# Patient Record
Sex: Male | Born: 1935 | Race: White | Hispanic: No | Smoking: Former smoker
Health system: Southern US, Community
[De-identification: ages and names within clinical notes are randomized; demographics above are authoritative.]

## PROBLEM LIST (undated history)

## (undated) DIAGNOSIS — N411 Chronic prostatitis: Secondary | ICD-10-CM

## (undated) DIAGNOSIS — C61 Malignant neoplasm of prostate: Secondary | ICD-10-CM

## (undated) DIAGNOSIS — I2510 Atherosclerotic heart disease of native coronary artery without angina pectoris: Secondary | ICD-10-CM

## (undated) DIAGNOSIS — E785 Hyperlipidemia, unspecified: Secondary | ICD-10-CM

## (undated) DIAGNOSIS — G459 Transient cerebral ischemic attack, unspecified: Secondary | ICD-10-CM

## (undated) DIAGNOSIS — I10 Essential (primary) hypertension: Secondary | ICD-10-CM

## (undated) DIAGNOSIS — I219 Acute myocardial infarction, unspecified: Secondary | ICD-10-CM

## (undated) DIAGNOSIS — Z952 Presence of prosthetic heart valve: Secondary | ICD-10-CM

## (undated) DIAGNOSIS — N289 Disorder of kidney and ureter, unspecified: Secondary | ICD-10-CM

## (undated) HISTORY — DX: Essential (primary) hypertension: I10

## (undated) HISTORY — DX: Presence of prosthetic heart valve: Z95.2

## (undated) HISTORY — PX: KIDNEY SURGERY: SHX687

## (undated) HISTORY — DX: Atherosclerotic heart disease of native coronary artery without angina pectoris: I25.10

## (undated) HISTORY — DX: Disorder of kidney and ureter, unspecified: N28.9

## (undated) HISTORY — DX: Chronic prostatitis: N41.1

## (undated) HISTORY — DX: Acute myocardial infarction, unspecified: I21.9

## (undated) HISTORY — DX: Malignant neoplasm of prostate: C61

## (undated) HISTORY — PX: CORONARY ARTERY BYPASS GRAFT: SHX141

## (undated) HISTORY — PX: AORTIC VALVE REPLACEMENT: SHX41

## (undated) HISTORY — DX: Hyperlipidemia, unspecified: E78.5

---

## 2010-03-01 ENCOUNTER — Encounter: Payer: Self-pay | Admitting: Internal Medicine

## 2010-03-10 ENCOUNTER — Encounter: Payer: Self-pay | Admitting: Internal Medicine

## 2010-04-10 ENCOUNTER — Encounter: Payer: Self-pay | Admitting: Internal Medicine

## 2013-05-13 ENCOUNTER — Ambulatory Visit: Payer: Self-pay | Admitting: Urology

## 2014-08-02 ENCOUNTER — Other Ambulatory Visit: Payer: Self-pay | Admitting: Family Medicine

## 2014-08-04 ENCOUNTER — Encounter: Payer: Self-pay | Admitting: Cardiovascular Disease

## 2014-08-04 ENCOUNTER — Encounter (INDEPENDENT_AMBULATORY_CARE_PROVIDER_SITE_OTHER): Payer: Self-pay

## 2014-08-04 ENCOUNTER — Ambulatory Visit (INDEPENDENT_AMBULATORY_CARE_PROVIDER_SITE_OTHER): Payer: Medicare Other | Admitting: Cardiovascular Disease

## 2014-08-04 VITALS — BP 126/80 | HR 60 | Ht 70.0 in | Wt 166.5 lb

## 2014-08-04 DIAGNOSIS — I10 Essential (primary) hypertension: Secondary | ICD-10-CM | POA: Diagnosis not present

## 2014-08-04 DIAGNOSIS — Z951 Presence of aortocoronary bypass graft: Secondary | ICD-10-CM

## 2014-08-04 DIAGNOSIS — E785 Hyperlipidemia, unspecified: Secondary | ICD-10-CM

## 2014-08-04 DIAGNOSIS — Z953 Presence of xenogenic heart valve: Secondary | ICD-10-CM

## 2014-08-04 DIAGNOSIS — I700 Atherosclerosis of aorta: Secondary | ICD-10-CM

## 2014-08-04 DIAGNOSIS — I25810 Atherosclerosis of coronary artery bypass graft(s) without angina pectoris: Secondary | ICD-10-CM

## 2014-08-04 DIAGNOSIS — Z954 Presence of other heart-valve replacement: Secondary | ICD-10-CM

## 2014-08-04 MED ORDER — LOSARTAN POTASSIUM 25 MG PO TABS: 25.0000 mg | ORAL_TABLET | Freq: Every day | ORAL | Status: DC

## 2014-08-04 MED ORDER — CARVEDILOL 6.25 MG PO TABS: 6.2500 mg | ORAL_TABLET | Freq: Two times a day (BID) | ORAL | Status: DC

## 2014-08-04 MED ORDER — PRAVASTATIN SODIUM 40 MG PO TABS: 40.0000 mg | ORAL_TABLET | Freq: Every day | ORAL | Status: DC

## 2014-08-04 NOTE — Progress Notes (Signed)
Patient ID: Jimmy Friedman, male    DOB: Apr 24, 1927, 81 y.o.   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  In general he reports that he is doing well. He is active, no regular exercise program. 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  Denies having any significant chest pain with exertion. No lower extremity edema.  Notes provided from previous cardiology shows LDL 66.  CT scan March 2016 showing moderate thoracic aortic atherosclerosis without aneurysm reticular and groundglass opacities at the lung bases, maybe atelectasis versus pneumonitis, also with gallstones, diverticulosis There was no ascending aorta aneurysm  No significant prior smoking history. He does report chewing on cigarettes/cigars.  Other past medical history Prior stress test reviewed with him from 03/24/2014. He exercised for 9 minutes, Bruce protocol, achieved 10 METS, hypertensive response with exercise, achieved target heart rate, no EKG changes concerning for ischemia  Echocardiogram 03/20/2014 with ejection fraction 60%, mild LVH, mild to moderately elevated right heart pressures 46-50 mmHg   Cardiac catheterization September 2011 with 90% proximal left circumflex disease, 90% mid LAD disease, 60% mid to distal RCA disease       Allergies  Allergen Reactions  . Penicillins     Other reaction(s): Unknown  . Sulfamethoxazole-Trimethoprim     Other  reaction(s): Unknown    No current outpatient prescriptions on file prior to visit.   No current facility-administered medications on file prior to visit.    Past Medical History  Diagnosis Date  . Coronary artery disease   . MI (myocardial infarction)   . S/P AVR (aortic valve replacement)   . Prostate cancer   . Kidney problem     Past Surgical History  Procedure Laterality Date  . Coronary artery bypass graft    . Aortic valve replacement    . Kidney surgery      Social History  reports that he has never smoked. He does not have any smokeless tobacco history on file. He reports that he drinks alcohol. He reports that he does not use illicit drugs.  Family History family history includes Heart Problems in his father and mother.   Review of Systems  Constitutional: Negative.   Respiratory: Negative.   Cardiovascular: Negative.   Gastrointestinal: Negative.   Endocrine: Negative.   Musculoskeletal: Negative.   Neurological: Negative.   Hematological: Negative.   Psychiatric/Behavioral: Negative.   All other systems reviewed and are negative.   BP 126/80 mmHg  Pulse 60  Ht 5\' 10"  (1.778 m)  Wt 166 lb 8 oz (75.524 kg)  BMI 23.89 kg/m2  Physical Exam  Constitutional: He is oriented to person, place, and time. He appears well-developed and well-nourished.  HENT:  Head: Normocephalic.  Nose: Nose normal.  Mouth/Throat: Oropharynx is clear and moist.  Eyes: Conjunctivae are normal. Pupils are equal, round, and reactive to light.  Neck: Normal range of motion. Neck supple. No JVD present.  Cardiovascular: Normal rate, regular rhythm, S1 normal, S2 normal and intact distal pulses.  Exam reveals no gallop  and no friction rub.   Murmur heard.  Crescendo systolic murmur is present with a grade of 2/6  Pulmonary/Chest: Effort normal and breath sounds normal. No respiratory distress. He has no wheezes. He has no rales. He exhibits no tenderness.  Abdominal: Soft.  Bowel sounds are normal. He exhibits no distension. There is no tenderness.  Musculoskeletal: Normal range of motion. He exhibits no edema or tenderness.  Lymphadenopathy:    He has no cervical adenopathy.  Neurological: He is alert and oriented to person, place, and time. Coordination normal.  Skin: Skin is warm and dry. No rash noted. No erythema.  Psychiatric: He has a normal mood and affect. His behavior is normal. Judgment and thought content normal.      Assessment and Plan   Nursing note and vitals reviewed.

## 2014-08-04 NOTE — Patient Instructions (Addendum)
You are doing well. No medication changes were made.  Please call us if you have new issues that need to be addressed before your next appt.  Your physician wants you to follow-up in: 6 months.  You will receive a reminder letter in the mail two months in advance. If you don't receive a letter, please call our office to schedule the follow-up appointment.   

## 2014-08-04 NOTE — Assessment & Plan Note (Signed)
Prior echocardiogram March 2016 We'll repeat March 2017 at his request for recertification for pilot license

## 2014-08-04 NOTE — Assessment & Plan Note (Signed)
Blood pressure is well controlled on today's visit. No changes made to the medications. 

## 2014-08-04 NOTE — Assessment & Plan Note (Signed)
Cholesterol is at goal on the current lipid regimen. No changes to the medications were made. Lab work to be done through primary care

## 2014-08-04 NOTE — Assessment & Plan Note (Signed)
Currently with no symptoms of angina. No further workup at this time. Continue current medication regimen. 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

## 2014-08-04 NOTE — Assessment & Plan Note (Signed)
We will try to request his bypass surgery details from 2011 Three-vessel bypass

## 2014-08-18 DIAGNOSIS — N411 Chronic prostatitis: Secondary | ICD-10-CM

## 2014-08-19 ENCOUNTER — Ambulatory Visit (INDEPENDENT_AMBULATORY_CARE_PROVIDER_SITE_OTHER): Payer: Medicare Other | Admitting: Unknown Physician Specialty

## 2014-08-19 ENCOUNTER — Encounter: Payer: Self-pay | Admitting: Unknown Physician Specialty

## 2014-08-19 VITALS — BP 171/63 | HR 71 | Temp 97.6°F | Ht 70.0 in | Wt 162.6 lb

## 2014-08-19 DIAGNOSIS — I25810 Atherosclerosis of coronary artery bypass graft(s) without angina pectoris: Secondary | ICD-10-CM

## 2014-08-19 DIAGNOSIS — Z954 Presence of other heart-valve replacement: Secondary | ICD-10-CM | POA: Diagnosis not present

## 2014-08-19 DIAGNOSIS — C61 Malignant neoplasm of prostate: Secondary | ICD-10-CM | POA: Diagnosis not present

## 2014-08-19 DIAGNOSIS — Z953 Presence of xenogenic heart valve: Secondary | ICD-10-CM

## 2014-08-19 DIAGNOSIS — N411 Chronic prostatitis: Secondary | ICD-10-CM | POA: Diagnosis not present

## 2014-08-19 DIAGNOSIS — I10 Essential (primary) hypertension: Secondary | ICD-10-CM

## 2014-08-19 DIAGNOSIS — E785 Hyperlipidemia, unspecified: Secondary | ICD-10-CM

## 2014-08-19 LAB — UA/M W/RFLX CULTURE, ROUTINE
Bilirubin, UA: NEGATIVE
Glucose, UA: NEGATIVE
Ketones, UA: NEGATIVE
Leukocytes, UA: NEGATIVE
NITRITE UA: NEGATIVE
Protein, UA: NEGATIVE
RBC, UA: NEGATIVE
SPEC GRAV UA: 1.015 (ref 1.005–1.030)
UUROB: 0.2 mg/dL (ref 0.2–1.0)
pH, UA: 5.5 (ref 5.0–7.5)

## 2014-08-19 LAB — MICROALBUMIN, URINE WAIVED
Creatinine, Urine Waived: 100 mg/dL (ref 10–300)
Microalb, Ur Waived: 10 mg/L (ref 0–19)

## 2014-08-19 MED ORDER — CLINDAMYCIN HCL 150 MG PO CAPS: 600.0000 mg | ORAL_CAPSULE | Freq: Every day | ORAL | Status: DC | PRN

## 2014-08-19 NOTE — Assessment & Plan Note (Signed)
Poor control today.  Ded not take medications today.  Good control at home and last visit.

## 2014-08-19 NOTE — Assessment & Plan Note (Signed)
Refill prophylactic antibiotics

## 2014-08-19 NOTE — Assessment & Plan Note (Signed)
Stable.  Planning to see a urologist at [facility]

## 2014-08-19 NOTE — Assessment & Plan Note (Signed)
Check PSA today. 

## 2014-08-19 NOTE — Progress Notes (Signed)
BP 171/63 mmHg  Pulse 71  Temp(Src) 97.6 F (36.4 C)  Ht 5\' 10"  (1.778 m)  Wt 162 lb 9.6 oz (73.755 kg)  BMI 23.33 kg/m2  SpO2 96%   Subjective:    Patient ID: Jimmy Friedman, male    DOB: [birth year], 81 y.o.   MRN: [id number]  HPI: Jimmy Friedman is a 81 y.o. male  Chief Complaint  Patient presents with  . Urinary Tract Infection   Hypertension/Hypercholesterol These problems are chronic.  In the past these were filled by the cardiologist following aortic valve replacement.  He would like Clindamyacin refilled that he gets before dental procedures. No chest pain or SOB. BP is up today and check it at home.  He did not take his medication today.  Typically SBP is bewlow 140 and check it weekly at home.      Chronic prostatitis  Pt with a history of prostate cancer and chronic prostatitis.  This is stable.  Needs PSA testing.  No urinary problems today.  Takes Macrodantin prn.    Relevant past medical, surgical, family and social history reviewed and updated as indicated. Interim medical history since our last visit reviewed. Allergies and medications reviewed and updated.  Review of Systems  Constitutional: Negative.   HENT: Negative.   Eyes: Negative.   Respiratory: Negative.   Cardiovascular: Negative.   Gastrointestinal: Negative.   Endocrine: Negative.   Genitourinary: Negative.   Skin: Negative.   Allergic/Immunologic: Negative.   Neurological: Negative.   Hematological: Negative.   Psychiatric/Behavioral: Negative.     Per HPI unless specifically indicated above     Objective:    BP 171/63 mmHg  Pulse 71  Temp(Src) 97.6 F (36.4 C)  Ht 5\' 10"  (1.778 m)  Wt 162 lb 9.6 oz (73.755 kg)  BMI 23.33 kg/m2  SpO2 96%  Wt Readings from Last 3 Encounters:  08/19/14 162 lb 9.6 oz (73.755 kg)  07/29/13 165 lb (74.844 kg)  08/04/14 166 lb 8 oz (75.524 kg)    Physical Exam  Constitutional: He is oriented to person, place, and time. He appears  well-developed and well-nourished. No distress.  HENT:  Head: Normocephalic and atraumatic.  Eyes: Conjunctivae and lids are normal. Right eye exhibits no discharge. Left eye exhibits no discharge. No scleral icterus.  Cardiovascular: Normal rate, regular rhythm and normal heart sounds.   Pulmonary/Chest: Effort normal and breath sounds normal. No respiratory distress.  Abdominal: Normal appearance. There is no splenomegaly or hepatomegaly. There is tenderness.  Musculoskeletal: Normal range of motion.  Neurological: He is alert and oriented to person, place, and time.  Skin: Skin is intact. No rash noted. No pallor.  Psychiatric: He has a normal mood and affect. His behavior is normal. Judgment and thought content normal.    No results found for this or any previous visit.    Assessment & Plan:   Problem List Items Addressed This Visit      Unprioritized   Hyperlipidemia    Stable.  Continue present meds      Relevant Orders   Lipid Panel w/o Chol/HDL Ratio   Comprehensive metabolic panel   Essential hypertension    Poor control today.  Ded not take medications today.  Good control at home and last visit.        Relevant Orders   Lipid Panel w/o Chol/HDL Ratio   Comprehensive metabolic panel   Uric acid   Microalbumin, Urine Waived   S/P aortic valve replacement with  bioprosthetic valve    Refill prophylactic antibiotics      Chronic prostatitis - Primary    Stable.  Planning to see a urologist at [place]   UA/M w/rflx Culture, Routine   PSA   Prostate cancer    Check PSA today      Relevant Orders   PSA       Follow up plan: Return in about 6 months (around 02/19/2015) for physical.

## 2014-08-19 NOTE — Assessment & Plan Note (Signed)
Stable.  Continue present meds 

## 2014-08-20 LAB — COMPREHENSIVE METABOLIC PANEL
ALBUMIN: 4.3 g/dL (ref 3.5–4.7)
ALT: 20 IU/L (ref 0–44)
AST: 30 IU/L (ref 0–40)
Albumin/Globulin Ratio: 1.8 (ref 1.1–2.5)
Alkaline Phosphatase: 50 IU/L (ref 39–117)
BUN/Creatinine Ratio: 18 (ref 10–22)
BUN: 21 mg/dL (ref 8–27)
Bilirubin Total: 1 mg/dL (ref 0.0–1.2)
CHLORIDE: 99 mmol/L (ref 97–108)
CO2: 27 mmol/L (ref 18–29)
CREATININE: 1.2 mg/dL (ref 0.76–1.27)
Calcium: 9.7 mg/dL (ref 8.6–10.2)
GFR, EST AFRICAN AMERICAN: 63 mL/min/{1.73_m2} (ref >59)
GFR, EST NON AFRICAN AMERICAN: 55 mL/min/{1.73_m2} — AB (ref >59)
GLUCOSE: 92 mg/dL (ref 65–99)
Globulin, Total: 2.4 g/dL (ref 1.5–4.5)
Potassium: 4.5 mmol/L (ref 3.5–5.2)
Sodium: 139 mmol/L (ref 134–144)
TOTAL PROTEIN: 6.7 g/dL (ref 6.0–8.5)

## 2014-08-20 LAB — LIPID PANEL W/O CHOL/HDL RATIO
Cholesterol, Total: 133 mg/dL (ref 100–199)
HDL: 48 mg/dL (ref >39)
LDL Calculated: 71 mg/dL (ref 0–99)
TRIGLYCERIDES: 70 mg/dL (ref 0–149)
VLDL Cholesterol Cal: 14 mg/dL (ref 5–40)

## 2014-08-20 LAB — URIC ACID: Uric Acid: 6.8 mg/dL (ref 3.7–8.6)

## 2014-08-20 LAB — PSA: Prostate Specific Ag, Serum: 4.5 ng/mL — ABNORMAL HIGH (ref 0.0–4.0)

## 2014-08-21 ENCOUNTER — Encounter: Payer: Self-pay | Admitting: Unknown Physician Specialty

## 2014-08-27 ENCOUNTER — Other Ambulatory Visit: Payer: Self-pay | Admitting: Family Medicine

## 2014-09-19 ENCOUNTER — Other Ambulatory Visit: Payer: Self-pay | Admitting: Family Medicine

## 2014-10-21 ENCOUNTER — Ambulatory Visit: Payer: Medicare Other

## 2014-10-21 DIAGNOSIS — Z23 Encounter for immunization: Secondary | ICD-10-CM

## 2014-11-03 ENCOUNTER — Other Ambulatory Visit: Payer: Self-pay | Admitting: Family Medicine

## 2015-01-02 ENCOUNTER — Other Ambulatory Visit: Payer: Self-pay | Admitting: Family Medicine

## 2015-01-07 ENCOUNTER — Other Ambulatory Visit: Payer: Self-pay | Admitting: Unknown Physician Specialty

## 2015-01-07 DIAGNOSIS — N411 Chronic prostatitis: Secondary | ICD-10-CM

## 2015-01-07 NOTE — Progress Notes (Signed)
Patient is on lab schedule for tomorrow. They will arrive him and I will release the order.

## 2015-01-07 NOTE — Progress Notes (Signed)
Phone call with patient about pain with urination and noted cloudy urine.  He was allowed to bring a urine in without an appointment.  I will be able to run it tomorrow and notify patient of results.  He is presently on daily doxycycline for chronic UTIs.

## 2015-01-08 ENCOUNTER — Other Ambulatory Visit: Payer: Medicare Other

## 2015-01-08 ENCOUNTER — Other Ambulatory Visit: Payer: Self-pay | Admitting: Unknown Physician Specialty

## 2015-01-08 LAB — MICROSCOPIC EXAMINATION: Epithelial Cells (non renal): NONE SEEN /hpf (ref 0–10)

## 2015-01-08 MED ORDER — CIPROFLOXACIN HCL 500 MG PO TABS: 500.0000 mg | ORAL_TABLET | Freq: Two times a day (BID) | ORAL | Status: DC

## 2015-01-08 NOTE — Progress Notes (Signed)
Called and let patient know about urine and prescription.

## 2015-01-08 NOTE — Progress Notes (Signed)
Please tell patient urine is positive and I called in Cipro for him

## 2015-01-11 LAB — URINE CULTURE, REFLEX

## 2015-01-12 ENCOUNTER — Encounter: Payer: Self-pay | Admitting: Unknown Physician Specialty

## 2015-01-12 ENCOUNTER — Ambulatory Visit (INDEPENDENT_AMBULATORY_CARE_PROVIDER_SITE_OTHER): Payer: Medicare Other | Admitting: Unknown Physician Specialty

## 2015-01-12 VITALS — BP 110/62 | HR 73 | Temp 97.4°F | Ht 69.7 in | Wt 167.0 lb

## 2015-01-12 DIAGNOSIS — N411 Chronic prostatitis: Secondary | ICD-10-CM

## 2015-01-12 DIAGNOSIS — N3090 Cystitis, unspecified without hematuria: Secondary | ICD-10-CM

## 2015-01-12 LAB — MICROSCOPIC EXAMINATION
RBC MICROSCOPIC, UA: NONE SEEN /HPF (ref 0–?)
WBC UA: NONE SEEN /HPF (ref 0–?)

## 2015-01-12 LAB — UA/M W/RFLX CULTURE, ROUTINE
Bilirubin, UA: NEGATIVE
GLUCOSE, UA: NEGATIVE
Leukocytes, UA: NEGATIVE
NITRITE UA: NEGATIVE
RBC, UA: NEGATIVE
Specific Gravity, UA: 1.025 (ref 1.005–1.030)
UUROB: 0.2 mg/dL (ref 0.2–1.0)
pH, UA: 5.5 (ref 5.0–7.5)

## 2015-01-12 NOTE — Progress Notes (Signed)
   BP 110/62 mmHg  Pulse 73  Temp(Src) 97.4 F (36.3 C)  Ht 5' 9.7" (1.77 m)  Wt 167 lb (75.751 kg)  BMI 24.18 kg/m2  SpO2 92%   Subjective:    Patient ID: Jimmy Friedman, male    DOB: [birth year], 82 y.o.   MRN: [id number]  HPI: Jimmy Friedman is a 82 y.o. male  Chief Complaint  Patient presents with  . Urinary Tract Infection    pt was given cipro 01/08/15 and states he thinks it helped. States he is not having anymore symptoms.   As above, pt with history of UTIs due to urinary retentin due to BPH.  Culture results show cipro is working.  He does follow up with a Urologist and does not want an additional visit.   Relevant past medical, surgical, family and social history reviewed and updated as indicated. Interim medical history since our last visit reviewed. Allergies and medications reviewed and updated.  Review of Systems  Per HPI unless specifically indicated above     Objective:    BP 110/62 mmHg  Pulse 73  Temp(Src) 97.4 F (36.3 C)  Ht 5' 9.7" (1.77 m)  Wt 167 lb (75.751 kg)  BMI 24.18 kg/m2  SpO2 92%  Wt Readings from Last 3 Encounters:  01/12/15 167 lb (75.751 kg)  08/19/14 162 lb 9.6 oz (73.755 kg)  07/29/13 165 lb (74.844 kg)    Physical Exam  Constitutional: He is oriented to person, place, and time. He appears well-developed and well-nourished. No distress.  HENT:  Head: Normocephalic and atraumatic.  Eyes: Conjunctivae and lids are normal. Right eye exhibits no discharge. Left eye exhibits no discharge. No scleral icterus.  Neck: Normal range of motion. Neck supple. No JVD present. Carotid bruit is not present.  Cardiovascular: Normal rate, regular rhythm and normal heart sounds.   Pulmonary/Chest: Effort normal and breath sounds normal. No respiratory distress.  Abdominal: Normal appearance. There is no splenomegaly or hepatomegaly.  Musculoskeletal: Normal range of motion.  Neurological: He is alert and oriented to person, place, and  time.  Skin: Skin is warm, dry and intact. No rash noted. No pallor.  Psychiatric: He has a normal mood and affect. His behavior is normal. Judgment and thought content normal.    Results for orders placed or performed in visit on 01/08/15  Microscopic Examination  Result Value Ref Range   WBC, UA 6-10 (A) 0 -  5 /hpf   RBC, UA 3-10 (A) 0 -  2 /hpf   Epithelial Cells (non renal) None seen 0 - 10 /hpf   Bacteria, UA Moderate (A) None seen/Few  UA/M w/rflx Culture, Routine  Result Value Ref Range   Urine Culture, Routine Final report (A)    Urine Culture result 1 Proteus mirabilis (A)    ANTIMICROBIAL SUSCEPTIBILITY Comment   Urine Culture, Routine  Result Value Ref Range   Urine Culture result 1 Gram negative rods (A)       Assessment & Plan:   Problem List Items Addressed This Visit      Unprioritized   Chronic prostatitis - Primary   Relevant Orders   UA/M w/rflx Culture, Routine    Other Visit Diagnoses    Cystitis        resolved        Follow up plan: Return if symptoms worsen or fail to improve.

## 2015-02-16 ENCOUNTER — Other Ambulatory Visit: Payer: Self-pay | Admitting: Family Medicine

## 2015-02-16 NOTE — Telephone Encounter (Signed)
Your patient 

## 2015-02-18 ENCOUNTER — Encounter: Payer: Self-pay | Admitting: Cardiovascular Disease

## 2015-02-18 ENCOUNTER — Ambulatory Visit (INDEPENDENT_AMBULATORY_CARE_PROVIDER_SITE_OTHER): Payer: Medicare Other | Admitting: Cardiovascular Disease

## 2015-02-18 VITALS — BP 148/64 | HR 66 | Ht 70.0 in | Wt 171.8 lb

## 2015-02-18 DIAGNOSIS — I10 Essential (primary) hypertension: Secondary | ICD-10-CM | POA: Diagnosis not present

## 2015-02-18 DIAGNOSIS — I25810 Atherosclerosis of coronary artery bypass graft(s) without angina pectoris: Secondary | ICD-10-CM

## 2015-02-18 DIAGNOSIS — I700 Atherosclerosis of aorta: Secondary | ICD-10-CM

## 2015-02-18 DIAGNOSIS — Z954 Presence of other heart-valve replacement: Secondary | ICD-10-CM

## 2015-02-18 DIAGNOSIS — Z951 Presence of aortocoronary bypass graft: Secondary | ICD-10-CM | POA: Diagnosis not present

## 2015-02-18 DIAGNOSIS — Z953 Presence of xenogenic heart valve: Secondary | ICD-10-CM

## 2015-02-18 DIAGNOSIS — E785 Hyperlipidemia, unspecified: Secondary | ICD-10-CM

## 2015-02-18 NOTE — Assessment & Plan Note (Signed)
Cholesterol is at goal on the current lipid regimen. No changes to the medications were made.  

## 2015-02-18 NOTE — Assessment & Plan Note (Signed)
Echocardiogram March 2016 at outside facility showing stable bioprosthetic aortic valve He does not want echocardiogram this year, we'll reconsider next year, no change in clinical symptoms

## 2015-02-18 NOTE — Patient Instructions (Signed)
You are doing well. No medication changes were made.  Please call us if you have new issues that need to be addressed before your next appt.  Your physician wants you to follow-up in: 6 months.  You will receive a reminder letter in the mail two months in advance. If you don't receive a letter, please call our office to schedule the follow-up appointment.   

## 2015-02-18 NOTE — Assessment & Plan Note (Signed)
Blood pressure is well controlled on today's visit. No changes made to the medications. 

## 2015-02-18 NOTE — Assessment & Plan Note (Signed)
Atherosclerosis seen on previous CT scan Again recommended aggressive cholesterol management Currently nonsmoker, nondiabetic

## 2015-02-18 NOTE — Assessment & Plan Note (Signed)
Currently with no symptoms of angina. No further workup at this time. Continue current medication regimen. 

## 2015-02-18 NOTE — Progress Notes (Signed)
Patient ID: Jimmy Friedman, male    DOB: [birth year], 82 y.o.   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 Last stress test was March 2016  In general he reports that he is doing well. He is active, no regular exercise program. 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  Denies having any significant chest pain with exertion.  No lower extremity edema.    lab work below reviewed with him in detail    Lab Results      Component                Value               Date                      CHOL                     133                 08/19/2014                HDL                      48                  08/19/2014                LDLCALC                  71                  08/19/2014                TRIG                     70                  08/19/2014         EKG on today's visit shows normal sinus rhythm with rate 66 bpm, APCs, interventricular conduction delay, poor R-wave progression through the anterior precordial leads, left anterior fascicular block  Other past medical history reviewed CT scan March 2016 showing moderate thoracic aortic atherosclerosis without aneurysm reticular and groundglass opacities at the lung bases, maybe atelectasis versus pneumonitis, also with gallstones, diverticulosis There was no ascending aorta aneurysm  No significant prior smoking history.  He does report chewing on cigarettes/cigars.  Prior stress test  03/24/2014. He exercised for 9 minutes, Bruce protocol, achieved 10 METS, hypertensive response with exercise, achieved target heart rate, no EKG changes concerning for ischemia  Echocardiogram 03/20/2014 with ejection fraction 60%, mild LVH, mild to moderately elevated right heart pressures 46-50 mmHg   Cardiac catheterization September 2011 with 90% proximal left circumflex disease, 90% mid LAD disease, 60% mid to distal RCA disease       Allergies  Allergen Reactions  . Penicillins     Other reaction(s): Unknown  . Sulfamethoxazole-Trimethoprim     Other reaction(s): Unknown    Current Outpatient Prescriptions on File Prior to Visit  Medication Sig Dispense Refill  . aspirin 81 MG tablet Take 81 mg by mouth daily.    Marland Kitchen CALCIUM-MAGNESIUM-ZINC PO Take by mouth daily.    . carvedilol (COREG) 6.25 MG tablet Take 1 tablet (6.25 mg total) by mouth 2 (two) times daily. 180 tablet 3  . Cholecalciferol (VITAMIN D3) 2000 UNITS TABS Take 2,000 mg by mouth daily.    . clindamycin (CLEOCIN) 150 MG capsule Take 4 capsules (600 mg total) by mouth daily as needed. One hour before dental treatments. 24 capsule 1  . doxycycline (VIBRA-TABS) 100 MG tablet TAKE 1 TABLET BY MOUTH DAILY 30 tablet 0  . fluticasone (FLOVENT HFA) 220 MCG/ACT inhaler Inhale into the lungs.    . Glucosamine-Chondroitin 500-400 MG CAPS Take by mouth 2 (two) times daily.    Marland Kitchen losartan (COZAAR) 25 MG tablet Take 1 tablet (25 mg total) by mouth daily. 90 tablet 3  . Multiple Vitamin (MULTIVITAMIN) tablet Take 1 tablet by mouth daily.    . pravastatin (PRAVACHOL) 40 MG tablet TAKE 1 TABLET EACH DAY 30 tablet 1  . vitamin C (ASCORBIC ACID) 500 MG tablet Take 500 mg by mouth daily.    Marland Kitchen VITAMIN E PO Take by mouth daily.     No current facility-administered medications on file prior to visit.    Past Medical History  Diagnosis Date  . Coronary artery disease    . MI (myocardial infarction) ([place])   . S/P AVR (aortic valve replacement)   . Prostate cancer ([place])   . Kidney problem   . Hypertension   . Hyperlipidemia   . Chronic prostatitis     Past Surgical History  Procedure Laterality Date  . Coronary artery bypass graft    . Aortic valve replacement    . Kidney surgery      Social History  reports that he has never smoked. He has never used smokeless tobacco. He reports that he does not drink alcohol or use illicit drugs.  Family History family history includes Heart Problems in his father, mother, and son; Hypertension in his father; Osteoporosis in his mother.   Review of Systems  Constitutional: Negative.   Respiratory: Negative.   Cardiovascular: Negative.   Gastrointestinal: Negative.   Endocrine: Negative.   Musculoskeletal: Negative.   Neurological: Negative.   Hematological: Negative.   Psychiatric/Behavioral: Negative.   All other systems reviewed and are negative.   BP 148/64 mmHg  Pulse 66  Ht 5\' 10"  (1.778 m)  Wt 171 lb 12 oz (77.905 kg)  BMI 24.64 kg/m2  Physical Exam  Constitutional: He is oriented to person, place, and time. He appears well-developed and well-nourished.  HENT:  Head: Normocephalic.  Nose: Nose normal.  Mouth/Throat: Oropharynx is clear and moist.  Eyes: Conjunctivae are normal. Pupils are equal, round, and reactive to light.  Neck: Normal range of motion. Neck supple. No JVD present.  Cardiovascular: Normal rate, regular rhythm, S1 normal, S2 normal and intact distal pulses.  Exam reveals no gallop and no friction rub.   Murmur heard.  Crescendo systolic murmur is present with a grade of 2/6  Pulmonary/Chest: Effort normal and breath sounds normal. No respiratory distress. He has no wheezes. He has no rales. He exhibits no tenderness.  Abdominal: Soft. Bowel sounds are normal. He exhibits no  distension. There is no tenderness.  Musculoskeletal: Normal range of motion. He exhibits no  edema or tenderness.  Lymphadenopathy:    He has no cervical adenopathy.  Neurological: He is alert and oriented to person, place, and time. Coordination normal.  Skin: Skin is warm and dry. No rash noted. No erythema.  Psychiatric: He has a normal mood and affect. His behavior is normal. Judgment and thought content normal.      Assessment and Plan   Nursing note and vitals reviewed.

## 2015-03-02 ENCOUNTER — Encounter: Payer: Self-pay | Admitting: Unknown Physician Specialty

## 2015-03-02 ENCOUNTER — Ambulatory Visit (INDEPENDENT_AMBULATORY_CARE_PROVIDER_SITE_OTHER): Payer: Medicare Other | Admitting: Unknown Physician Specialty

## 2015-03-02 VITALS — BP 105/66 | HR 84 | Temp 98.6°F | Ht 69.5 in | Wt 166.0 lb

## 2015-03-02 DIAGNOSIS — B359 Dermatophytosis, unspecified: Secondary | ICD-10-CM

## 2015-03-02 DIAGNOSIS — D229 Melanocytic nevi, unspecified: Secondary | ICD-10-CM

## 2015-03-02 DIAGNOSIS — I10 Essential (primary) hypertension: Secondary | ICD-10-CM | POA: Diagnosis not present

## 2015-03-02 DIAGNOSIS — I25810 Atherosclerosis of coronary artery bypass graft(s) without angina pectoris: Secondary | ICD-10-CM | POA: Diagnosis not present

## 2015-03-02 DIAGNOSIS — C61 Malignant neoplasm of prostate: Secondary | ICD-10-CM

## 2015-03-02 DIAGNOSIS — Z23 Encounter for immunization: Secondary | ICD-10-CM

## 2015-03-02 DIAGNOSIS — N411 Chronic prostatitis: Secondary | ICD-10-CM

## 2015-03-02 DIAGNOSIS — Z951 Presence of aortocoronary bypass graft: Secondary | ICD-10-CM

## 2015-03-02 DIAGNOSIS — E785 Hyperlipidemia, unspecified: Secondary | ICD-10-CM

## 2015-03-02 DIAGNOSIS — R05 Cough: Secondary | ICD-10-CM | POA: Diagnosis not present

## 2015-03-02 DIAGNOSIS — R053 Chronic cough: Secondary | ICD-10-CM

## 2015-03-02 LAB — MICROALBUMIN, URINE WAIVED
CREATININE, URINE WAIVED: 100 mg/dL (ref 10–300)
Microalb, Ur Waived: 30 mg/L — ABNORMAL HIGH (ref 0–19)

## 2015-03-02 MED ORDER — CLOTRIMAZOLE-BETAMETHASONE 1-0.05 % EX CREA: 1.0000 "application " | TOPICAL_CREAM | Freq: Two times a day (BID) | CUTANEOUS | Status: DC

## 2015-03-02 NOTE — Assessment & Plan Note (Signed)
Check lipid panel  

## 2015-03-02 NOTE — Progress Notes (Signed)
+  BP 105/66 mmHg  Pulse 84  Temp(Src) 98.6 F (37 C)  Ht 5' 9.5" (1.765 m)  Wt 166 lb (75.297 kg)  BMI 24.17 kg/m2  SpO2 89%   Subjective:    Patient ID: Jimmy Friedman, male    DOB: [birth year], 82 y.o.   MRN: [id number]  HPI: Jimmy Friedman is a 82 y.o. male  Chief Complaint  Patient presents with  . 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    Functional Status Survey: Is the patient deaf or have difficulty hearing?: No Does the patient have difficulty seeing, even when wearing glasses/contacts?: No Does the patient have difficulty concentrating, remembering, or making decisions?: No Does the patient have difficulty walking or climbing stairs?: No Does the patient have difficulty dressing or bathing?: No Does the patient have difficulty doing errands alone such as visiting a doctor's office or shopping?: No  Depression screen PHQ 2/9 03/02/2015  Decreased Interest 0  Down, Depressed, Hopeless 0  PHQ - 2 Score 0    Fall Risk  03/02/2015  Falls in the past year? No    Relevant past medical, surgical, family and social history reviewed and updated as indicated. Interim medical history since our last visit reviewed. Allergies and medications reviewed and updated.  See functional status, depression screen, and fall's risk assessment  under the appropriate section.    Pt is able to perform complex mental tasks, recognize clock face, recognize time and do a 3 item recall.     Review of Systems  Per HPI unless specifically indicated above     Objective:    BP 105/66 mmHg  Pulse 84  Temp(Src) 98.6 F (37 C)  Ht 5' 9.5"  (1.765 m)  Wt 166 lb (75.297 kg)  BMI 24.17 kg/m2  SpO2 89%  Wt Readings from Last 3 Encounters:  03/02/15 166 lb (75.297 kg)  02/18/15 171 lb 12 oz (77.905 kg)  01/12/15 167 lb (75.751 kg)    Physical Exam  Constitutional: He is oriented to person, place, and time. He appears well-developed and well-nourished.  HENT:  Head: Normocephalic.  Right Ear: Tympanic membrane, external ear and ear canal normal.  Left Ear: Tympanic membrane, external ear and ear canal normal.  Mouth/Throat: Uvula is midline, oropharynx is clear and moist and mucous membranes are normal.  Eyes: Pupils are equal, round, and reactive to light.  Cardiovascular: Normal rate and regular rhythm.  Exam reveals no gallop and no friction rub.   Murmur heard.  Systolic murmur is present with a grade of 2/6  Pulmonary/Chest: Effort normal and breath sounds normal. No respiratory distress.  Abdominal: Soft. Bowel sounds are normal. He exhibits no distension. There is no tenderness.  Musculoskeletal: Normal range of motion.  Neurological: He is alert and oriented to person, place, and time. He has normal reflexes.  Skin: Skin is warm and dry.  2 areas of pt concern.  Back of right neck circular area with raised borders.  Fleshy mole on right lower back with clear borders and smaller than an eraser  Psychiatric: He has a normal mood and affect. His behavior is normal. Judgment and thought content normal.  Assessment & Plan:   Problem List Items Addressed This Visit      Unprioritized   S/P CABG x 3   Hyperlipidemia    Check lipid panel      Relevant Orders   Lipid Panel w/o Chol/HDL Ratio   Essential hypertension    Stable, continue present medications.        Relevant Orders   Comprehensive metabolic panel   Microalbumin, Urine Waived   Chronic prostatitis    Check PSA      Relevant Orders   PSA   Prostate cancer ([place]) - Primary   Relevant Medications   oxyCODONE-acetaminophen  (PERCOCET/ROXICET) 5-325 MG tablet   Other Relevant Orders   PSA   Chronic cough    Stable on present medications       Other Visit Diagnoses    Need for pneumococcal vaccination        Relevant Orders    Pneumococcal polysaccharide vaccine 23-valent greater than or equal to 2yo subcutaneous/IM (Completed)    Benign mole        Ringworm        r/o ringworm and give Lotrisone cream but will come in for a bx if no improvement    Relevant Medications    clotrimazole-betamethasone (LOTRISONE) cream        Follow up plan: Return in about 6 months (around 08/30/2015) for plus schedule skin biopsy.

## 2015-03-02 NOTE — Assessment & Plan Note (Signed)
Check PSA. ?

## 2015-03-02 NOTE — Assessment & Plan Note (Signed)
Stable, continue present medications.   

## 2015-03-02 NOTE — Assessment & Plan Note (Signed)
Stable on present medications.   

## 2015-03-03 LAB — COMPREHENSIVE METABOLIC PANEL
ALT: 18 IU/L (ref 0–44)
AST: 26 IU/L (ref 0–40)
Albumin/Globulin Ratio: 1.7 (ref 1.1–2.5)
Albumin: 4 g/dL (ref 3.5–4.7)
Alkaline Phosphatase: 39 IU/L (ref 39–117)
BUN/Creatinine Ratio: 19 (ref 10–22)
BUN: 26 mg/dL (ref 8–27)
Bilirubin Total: 0.8 mg/dL (ref 0.0–1.2)
CO2: 26 mmol/L (ref 18–29)
Calcium: 9.8 mg/dL (ref 8.6–10.2)
Chloride: 99 mmol/L (ref 96–106)
Creatinine, Ser: 1.4 mg/dL — ABNORMAL HIGH (ref 0.76–1.27)
GFR calc Af Amer: 52 mL/min/{1.73_m2} — ABNORMAL LOW (ref >59)
GFR calc non Af Amer: 45 mL/min/{1.73_m2} — ABNORMAL LOW (ref >59)
Globulin, Total: 2.3 g/dL (ref 1.5–4.5)
Glucose: 129 mg/dL — ABNORMAL HIGH (ref 65–99)
Potassium: 4.3 mmol/L (ref 3.5–5.2)
Sodium: 141 mmol/L (ref 134–144)
Total Protein: 6.3 g/dL (ref 6.0–8.5)

## 2015-03-03 LAB — LIPID PANEL W/O CHOL/HDL RATIO
Cholesterol, Total: 121 mg/dL (ref 100–199)
HDL: 39 mg/dL — ABNORMAL LOW (ref >39)
LDL CALC: 61 mg/dL (ref 0–99)
Triglycerides: 105 mg/dL (ref 0–149)
VLDL CHOLESTEROL CAL: 21 mg/dL (ref 5–40)

## 2015-03-03 LAB — PSA: Prostate Specific Ag, Serum: 4.8 ng/mL — ABNORMAL HIGH (ref 0.0–4.0)

## 2015-03-19 ENCOUNTER — Other Ambulatory Visit: Payer: Self-pay | Admitting: Unknown Physician Specialty

## 2015-04-04 ENCOUNTER — Encounter: Payer: Self-pay | Admitting: Emergency Medicine

## 2015-04-04 ENCOUNTER — Emergency Department: Payer: Medicare Other

## 2015-04-04 ENCOUNTER — Emergency Department
Admission: EM | Admit: 2015-04-04 | Discharge: 2015-04-04 | Disposition: A | Discharge status: Home / Self Care | Payer: Medicare Other | Attending: Emergency Medicine | Admitting: Emergency Medicine

## 2015-04-04 DIAGNOSIS — I119 Hypertensive heart disease without heart failure: Secondary | ICD-10-CM | POA: Diagnosis not present

## 2015-04-04 DIAGNOSIS — R0602 Shortness of breath: Secondary | ICD-10-CM | POA: Diagnosis present

## 2015-04-04 DIAGNOSIS — Z79899 Other long term (current) drug therapy: Secondary | ICD-10-CM | POA: Insufficient documentation

## 2015-04-04 DIAGNOSIS — I700 Atherosclerosis of aorta: Secondary | ICD-10-CM | POA: Diagnosis not present

## 2015-04-04 DIAGNOSIS — Z7982 Long term (current) use of aspirin: Secondary | ICD-10-CM | POA: Insufficient documentation

## 2015-04-04 DIAGNOSIS — J810 Acute pulmonary edema: Secondary | ICD-10-CM | POA: Diagnosis not present

## 2015-04-04 DIAGNOSIS — Z952 Presence of prosthetic heart valve: Secondary | ICD-10-CM | POA: Diagnosis not present

## 2015-04-04 DIAGNOSIS — E785 Hyperlipidemia, unspecified: Secondary | ICD-10-CM | POA: Insufficient documentation

## 2015-04-04 DIAGNOSIS — I2510 Atherosclerotic heart disease of native coronary artery without angina pectoris: Secondary | ICD-10-CM | POA: Insufficient documentation

## 2015-04-04 DIAGNOSIS — I213 ST elevation (STEMI) myocardial infarction of unspecified site: Secondary | ICD-10-CM | POA: Diagnosis not present

## 2015-04-04 DIAGNOSIS — C61 Malignant neoplasm of prostate: Secondary | ICD-10-CM | POA: Diagnosis not present

## 2015-04-04 LAB — COMPREHENSIVE METABOLIC PANEL
ALK PHOS: 38 U/L (ref 38–126)
ALT: 19 U/L (ref 17–63)
AST: 27 U/L (ref 15–41)
Albumin: 4.1 g/dL (ref 3.5–5.0)
Anion gap: 6 (ref 5–15)
BILIRUBIN TOTAL: 1.3 mg/dL — AB (ref 0.3–1.2)
BUN: 26 mg/dL — AB (ref 6–20)
CALCIUM: 10.1 mg/dL (ref 8.9–10.3)
CHLORIDE: 103 mmol/L (ref 101–111)
CO2: 28 mmol/L (ref 22–32)
CREATININE: 1.44 mg/dL — AB (ref 0.61–1.24)
GFR, EST AFRICAN AMERICAN: 49 mL/min — AB (ref >60)
GFR, EST NON AFRICAN AMERICAN: 42 mL/min — AB (ref >60)
Glucose, Bld: 92 mg/dL (ref 65–99)
Potassium: 3.5 mmol/L (ref 3.5–5.1)
Sodium: 137 mmol/L (ref 135–145)
Total Protein: 7.1 g/dL (ref 6.5–8.1)

## 2015-04-04 LAB — CBC WITH DIFFERENTIAL/PLATELET
BASOS PCT: 1 %
Basophils Absolute: 0.1 10*3/uL (ref 0–0.1)
EOS ABS: 0.2 10*3/uL (ref 0–0.7)
EOS PCT: 2 %
HCT: 40.4 % (ref 40.0–52.0)
Hemoglobin: 13.6 g/dL (ref 13.0–18.0)
LYMPHS ABS: 1 10*3/uL (ref 1.0–3.6)
Lymphocytes Relative: 12 %
MCH: 32.1 pg (ref 26.0–34.0)
MCHC: 33.5 g/dL (ref 32.0–36.0)
MCV: 95.6 fL (ref 80.0–100.0)
Monocytes Absolute: 1.1 10*3/uL — ABNORMAL HIGH (ref 0.2–1.0)
Monocytes Relative: 12 %
Neutro Abs: 6.2 10*3/uL (ref 1.4–6.5)
Neutrophils Relative %: 73 %
PLATELETS: 116 10*3/uL — AB (ref 150–440)
RBC: 4.23 MIL/uL — AB (ref 4.40–5.90)
RDW: 13.1 % (ref 11.5–14.5)
WBC: 8.5 10*3/uL (ref 3.8–10.6)

## 2015-04-04 LAB — TROPONIN I: TROPONIN I: 0.03 ng/mL (ref <0.031)

## 2015-04-04 LAB — BRAIN NATRIURETIC PEPTIDE: B Natriuretic Peptide: 541 pg/mL — ABNORMAL HIGH (ref 0.0–100.0)

## 2015-04-04 MED ORDER — FUROSEMIDE 20 MG PO TABS: 20.0000 mg | ORAL_TABLET | Freq: Every day | ORAL | Status: DC

## 2015-04-04 MED ORDER — FUROSEMIDE 40 MG PO TABS
20.0000 mg | ORAL_TABLET | Freq: Once | ORAL | Status: AC
  Administered 2015-04-04: 20 mg via ORAL
  Filled 2015-04-04: qty 1

## 2015-04-04 NOTE — ED Notes (Signed)
Patient presents to the ED with shortness of breath over the past 2 days.  Patient states whenever he goes to bed or lies down to take a nap, he will wake up suddenly, panicked and feeling like he's not breathing.  Patient states about an hour ago he woke up panicked for breath and checked his oxygen level and it was 75%.  Patient reports increased stress over the past couple of days due to his wife being admitted into the hospital.

## 2015-04-04 NOTE — Discharge Instructions (Signed)
Pulmonary Edema °Pulmonary edema (PE) is a condition in which fluid collects in the lungs. This makes it hard to breathe. PE may be a result of the heart not pumping very well or a result of injury.  °CAUSES  °· Coronary artery disease causes blockages in the arteries of the heart. This deprives the heart muscle of oxygen and weakens the muscle. A heart attack is a form of coronary artery disease. °· High blood pressure causes the heart muscle to work harder than usual. Over time, the heart muscle may get stiff, and it starts to work less efficiently. It may also fatigue and weaken. °· Viral infection of the heart (myocarditis) may weaken the heart muscle. °· Metabolic conditions such as thyroid disease, excessive alcohol use, certain vitamin deficiencies, or diabetes may also weaken the heart muscle. °· Leaky or stiff heart valves may impair normal heart function. °· Lung disease may strain the heart muscle. °· Excessive demands on the heart such as too much salt or fluid intake. °· Failure to take prescribed medicines. °· Lung injury from heat or toxins, such as poisonous gas. °· Infection in the lungs or other parts of the body. °· Fluid overload caused by kidney failure or medicines. °SYMPTOMS  °· Shortness of breath at rest or with exertion. °· Grunting, wheezing, or gurgling while breathing. °· Feeling like you cannot get enough air. °· Breaths are shallow and fast. °· A lot of coughing with frothy or bloody mucus. °· Skin may become cool, damp, and turn a pale or bluish color. °DIAGNOSIS  °Initial diagnosis may be based on your history, symptoms, and a physical examination. Additional tests for PE may include: °· Electrocardiography. °· Chest X-ray. °· Blood tests. °· Stress test. °· Ultrasound evaluation of the heart (echocardiography). °· Evaluation by a heart doctor (cardiologist). °· Test of the heart arteries to look for blockages (angiography). °· Check of blood oxygen. °TREATMENT  °Treatment of PE will  depend on the underlying cause and will focus first on relieving the symptoms.  °· Extra oxygen to make breathing easier and assist with removing mucus. This may include breathing treatments or a tube into the lungs and a breathing machine. °· Medicine to help the body get rid of extra water, usually through an IV tube. °· Medicine to help the heart pump better. °· If poor heart function is the cause, treatment may include: °¨ Procedures to open blocked arteries, repair damaged heart valves, or remove some of the damaged heart muscle. °¨ A pacemaker to help the heart pump with less effort. °HOME CARE INSTRUCTIONS  °· Your health care provider will help you determine what type of exercise program may be helpful. It is important to maintain strength and increase it if possible. Pace your activities to avoid shortness of breath or chest pain. Rest for at least 1 hour before and after meals. Cardiac rehabilitation programs are available in some locations. °· Eat a heart-healthy diet low in salt, saturated fat, and cholesterol. Ask for help with choices. °· Make a list of every medicine, vitamin, or herbal supplement you are taking. Keep the list with you at all times. Show it to your health care provider at every visit and before starting a new medicine. Keep the list up to date. °· Ask your health care provider or pharmacist to help you write a plan or schedule so that you know things about each medicine such as: °¨ Why you are taking it. °¨ The possible side effects. °¨   The best time of day to take it.  Foods to take with it or avoid.  When to stop taking it.  Record your hospital or clinic weight. When you get home, compare it to your scale and record your weight. Then, weigh yourself first thing in the morning daily, and record the weights. You should weigh yourself every morning after you urinate and before you eat breakfast. Wear the same amount of clothing each time you weigh yourself. Provide your health  care provider with your weight record. Daily weights are important in the early recognition of excess fluid. Tell your health care provider right away if you have gained 03 lb/1.4 kg in 1 day, 05 lb/2.3 kg in a week, or as directed by your health care provider. Your medicines may need to be adjusted.  Blood pressure monitoring should be done as often as directed. You can get a home blood pressure cuff at your drugstore. Record these values and bring them with you for your clinic visits. Notify your health care provider if you become dizzy or light-headed when standing up.  If you are currently a smoker, it is time to quit. Nicotine makes your heart work harder and is one of the leading causes of cardiac deaths. Do not use nicotine gum or patches before talking to your doctor.  Make a follow-up appointment with your health care provider as directed.  Ask your health care provider for a copy of your latest heart tracing (ECG) and keep a copy with you at all times. SEEK IMMEDIATE MEDICAL CARE IF:   You have severe chest pain, especially if the pain is crushing or pressure-like and spreads to the arms, back, neck, or jaw. THIS IS AN EMERGENCY. Do not wait to see if the pain will go away. Call for local emergency medical help. Do not drive yourself to the hospital.  You have sweating, feel sick to your stomach (nauseous), or are experiencing shortness of breath.  Your weight increases by 03 lb/1.4 kg in 1 day or 05 lb/2.3 kg in a week.  You notice increasing shortness of breath that is unusual for you. This may happen during rest, sleep, or with activity.  You develop chest pain (angina) or pain that is unusual for you.  You notice more swelling in your hands, feet, ankles, or abdomen.  You notice lasting (persistent) dizziness, blurred vision, headache, or unsteadiness.  You begin to cough up bloody mucus (sputum).  You are unable to sleep because it is hard to breathe.  You begin to feel a  "jumping" or "fluttering" sensation (palpitations) in the chest that is unusual for you. MAKE SURE YOU:  Understand these instructions.  Will watch your condition.  Will get help right away if you are not doing well or get worse.   This information is not intended to replace advice given to you by your health care provider. Make sure you discuss any questions you have with your health care provider.   Document Released: 03/18/2002 Document Revised: 12/31/2012 Document Reviewed: 09/02/2012 Elsevier Interactive Patient Education [employer].  Please return immediately if condition worsens. Please contact her primary physician or the physician you were given for referral. If you have any specialist physicians involved in her treatment and plan please also contact them. Thank you for using [facility]. Return to emergency department especially for fever, productive cough, chest pain, or any other new concerns

## 2015-04-04 NOTE — ED Notes (Signed)
Pt back from imaging

## 2015-04-04 NOTE — ED Provider Notes (Signed)
Time Seen: Approximately 1900  I have reviewed the triage notes  Chief Complaint: Shortness of Breath   History of Present Illness: Jimmy Friedman is a 82 y.o. male who was upstairs here in the hospital visiting his wife who is a patient. The patient states he's developed some mild shortness of breath over the last 2 days. 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 It is really feels anxious about his current wife's condition. He denies any associated chest pain or fever or productive cough. Patient denies any nausea, vomiting or peripheral edema. He has no current pulmonary emboli risk factors though he does have a history of prostate cancer. He states he has a chronic cough with a postnasal drip.   Past Medical History  Diagnosis Date  . Coronary artery disease   . MI (myocardial infarction) ([place])   . S/P AVR (aortic valve replacement)   . Prostate cancer ([place])   . Kidney problem   . Hypertension   . Hyperlipidemia   . Chronic prostatitis     Patient Active Problem List   Diagnosis Date Noted  . Chronic cough 03/02/2015  . Prostate cancer ([place]) 08/19/2014  . Chronic prostatitis 08/18/2014  . Coronary artery disease involving coronary bypass graft of native heart without angina pectoris 08/04/2014  . S/P CABG x 3 08/04/2014  . Hyperlipidemia 08/04/2014  . Essential hypertension 08/04/2014  . S/P aortic valve replacement with bioprosthetic valve 08/04/2014  . Thoracic aorta atherosclerosis ([place]) 08/04/2014    Past Surgical History  Procedure Laterality Date  . Coronary artery bypass graft    . Aortic valve replacement    . Kidney surgery      Past Surgical History  Procedure Laterality Date  . Coronary artery bypass graft    . Aortic  valve replacement    . Kidney surgery      Current Outpatient Rx  Name  Route  Sig  Dispense  Refill  . aspirin 81 MG tablet   Oral   Take 81 mg by mouth daily.         Marland Kitchen CALCIUM-MAGNESIUM-ZINC PO   Oral   Take by mouth daily.         . carvedilol (COREG) 6.25 MG tablet   Oral   Take 1 tablet (6.25 mg total) by mouth 2 (two) times daily.   180 tablet   3   . Cholecalciferol (VITAMIN D3) 2000 UNITS TABS   Oral   Take 2,000 mg by mouth daily.         . clindamycin (CLEOCIN) 150 MG capsule   Oral   Take 4 capsules (600 mg total) by mouth daily as needed. One hour before dental treatments.   24 capsule   1   . clotrimazole-betamethasone (LOTRISONE) cream   Topical   Apply 1 application topically 2 (two) times daily.   30 g   0   . doxycycline (VIBRA-TABS) 100 MG tablet      TAKE 1 TABLET BY MOUTH DAILY   30 tablet   0   . fluticasone (FLOVENT HFA) 220 MCG/ACT inhaler   Inhalation   Inhale into the lungs.         . Glucosamine-Chondroitin  500-400 MG CAPS   Oral   Take by mouth 2 (two) times daily.         Marland Kitchen losartan (COZAAR) 25 MG tablet   Oral   Take 1 tablet (25 mg total) by mouth daily.   90 tablet   3   . Multiple Vitamin (MULTIVITAMIN) tablet   Oral   Take 1 tablet by mouth daily.         Marland Kitchen oxyCODONE-acetaminophen (PERCOCET/ROXICET) 5-325 MG tablet   Oral   Take 1 tablet by mouth every 4 (four) hours as needed for severe pain.         . pravastatin (PRAVACHOL) 40 MG tablet      TAKE 1 TABLET EACH DAY   30 tablet   1   . vitamin C (ASCORBIC ACID) 500 MG tablet   Oral   Take 500 mg by mouth daily.         Marland Kitchen VITAMIN E PO   Oral   Take by mouth daily.           Allergies:  Penicillins and Sulfamethoxazole-trimethoprim  Family History: Family History  Problem Relation Age of Onset  . Heart Problems Mother   . Osteoporosis Mother   . Heart Problems Father   . Hypertension Father   . Heart Problems Son     Social  History: Social History  Substance Use Topics  . Smoking status: Never Smoker   . Smokeless tobacco: Never Used  . Alcohol Use: No     Review of Systems:   10 point review of systems was performed and was otherwise negative:  Constitutional: No fever Eyes: No visual disturbances ENT: No sore throat, ear pain Cardiac: No chest pain Respiratory: No Current shortness of breath, wheezing, or stridor Abdomen: No abdominal pain, no vomiting, No diarrhea Endocrine: No weight loss, No night sweats Extremities: No peripheral edema, cyanosis Skin: No rashes, easy bruising Neurologic: No focal weakness, trouble with speech or swollowing Urologic: No dysuria, Hematuria, or urinary frequency   Physical Exam:  ED Triage Vitals  Enc Vitals Group     BP 04/04/15 1813 197/81 mmHg     Pulse Rate 04/04/15 1813 75     Resp 04/04/15 1813 18     Temp 04/04/15 1813 98.1 F (36.7 C)     Temp Source 04/04/15 1813 Oral     SpO2 04/04/15 1813 97 %     Weight 04/04/15 1813 156 lb (70.761 kg)     Height 04/04/15 1813 5\' 10"  (1.778 m)     Head Cir --      Peak Flow --      Pain Score 04/04/15 1814 0     Pain Loc --      Pain Edu? --      Excl. in [place]? --     General: Awake , Alert , and Oriented times 3; GCS 15 Head: Normal cephalic , atraumatic Eyes: Pupils equal , round, reactive to light Nose/Throat: No nasal drainage, patent upper airway without erythema or exudate.  Neck: Supple, Full range of motion, No anterior adenopathy or palpable thyroid masses Lungs: Mild rales auscultated bilaterally at the base with no wheezes or rhonchi  Heart: Regular rate, regular rhythm without murmurs , gallops , or rubs Abdomen: Soft, non tender without rebound, guarding , or rigidity; bowel sounds positive and symmetric in all 4 quadrants. No organomegaly .        Extremities: 2 plus symmetric pulses. No edema, clubbing  or cyanosis Neurologic: normal ambulation, Motor symmetric without deficits, sensory  intact Skin: warm, dry, no rashes   Labs:   All laboratory work was reviewed including any pertinent negatives or positives listed below:  Labs Reviewed  [place]  CBC WITH DIFFERENTIAL/PLATELET  TROPONIN I    EKG:  ED ECG REPORT I, Daymon Larsen, the attending physician, personally viewed and interpreted this ECG.  Date: 04/04/2015 EKG Time: 1810 Rate: 72 Rhythm: normal sinus rhythm QRS Axis: normal Intervals: Right bundle-branch block, left anterior fascicular block ST/T Wave abnormalities: normal Conduction Disturbances: none Narrative Interpretation: unremarkable Left ventricular hypertrophy No obvious acute ischemic changes   Radiology: DG Chest 2 View (Final result) Result time: 04/04/15 19:25:56   Final result by Rad Results In Interface (04/04/15 19:25:56)   Narrative:   CLINICAL DATA: 82 year old male with shortness of breath  EXAM: CHEST 2 VIEW  COMPARISON: None.  FINDINGS: Two views of the chest demonstrate minimal increased density at the left lung base, likely atelectatic changes. Developing pneumonia is not excluded. There is no focal consolidation, pleural effusion, or pneumothorax. The cardiac silhouette is within normal limits. Median sternotomy wires and mechanical cardiac valve noted. No acute osseous pathology.  IMPRESSION: Left lung base atelectasis. Developing pneumonia is not excluded.   Electronically Signed By: Anner Crete M.D. On: 04/04/2015 19:25             I personally reviewed the radiologic studies    ED Course:  The patient's stay was uneventful and I felt given his presentation this was some mild pulmonary edema. The patient presents with feelings of orthopnea and has an elevated BNP and is afebrile with a normal white blood cell count. I felt this was unlikely to be early pneumonia at this time though the patient was advised to return here if he  develops a fever, chest pain, etc. Patient's pulse oximetry remained stable and again he was advised to follow up with his cardiologist and I placed him on a quick 5 day course of Lasix to see if he felt symptomatically improved. He states he is able to exercise without bruits 6 protocol and is not having any persistent problems when he is up and ambulatory, etc. he may have sleep apnea or possibly reflux can present this way.  Assessment: Orthopnea Mild pulmonary edema  F   Plan:  Outpatient management Patient was advised to return immediately if condition worsens. Patient was advised to follow up with their primary care physician or other specialized physicians involved in their outpatient care. The patient and/or family member/power of attorney had laboratory results reviewed at the bedside. All questions and concerns were addressed and appropriate discharge instructions were distributed by the nursing staff.            Daymon Larsen, MD 04/04/15 2122

## 2015-04-04 NOTE — ED Notes (Signed)
Pt transported to Xray. 

## 2015-04-05 ENCOUNTER — Telehealth: Payer: Self-pay | Admitting: Cardiovascular Disease

## 2015-04-05 NOTE — Telephone Encounter (Signed)
Pt was seen in ED on 04/04/15 for Short of Breath  Pt is coming to see [place] 05/31/15  He is on wait list.

## 2015-04-08 ENCOUNTER — Observation Stay
Admission: EM | Admit: 2015-04-08 | Discharge: 2015-04-09 | Disposition: A | Discharge status: Home / Self Care | Payer: Medicare Other | Attending: Internal Medicine | Admitting: Internal Medicine

## 2015-04-08 ENCOUNTER — Emergency Department: Payer: Medicare Other

## 2015-04-08 ENCOUNTER — Encounter: Payer: Self-pay | Admitting: Radiology

## 2015-04-08 DIAGNOSIS — Z79899 Other long term (current) drug therapy: Secondary | ICD-10-CM | POA: Diagnosis not present

## 2015-04-08 DIAGNOSIS — I2510 Atherosclerotic heart disease of native coronary artery without angina pectoris: Secondary | ICD-10-CM | POA: Diagnosis not present

## 2015-04-08 DIAGNOSIS — R410 Disorientation, unspecified: Secondary | ICD-10-CM | POA: Insufficient documentation

## 2015-04-08 DIAGNOSIS — N411 Chronic prostatitis: Secondary | ICD-10-CM | POA: Insufficient documentation

## 2015-04-08 DIAGNOSIS — Z8546 Personal history of malignant neoplasm of prostate: Secondary | ICD-10-CM | POA: Diagnosis not present

## 2015-04-08 DIAGNOSIS — I10 Essential (primary) hypertension: Secondary | ICD-10-CM | POA: Insufficient documentation

## 2015-04-08 DIAGNOSIS — I252 Old myocardial infarction: Secondary | ICD-10-CM | POA: Diagnosis not present

## 2015-04-08 DIAGNOSIS — G459 Transient cerebral ischemic attack, unspecified: Secondary | ICD-10-CM | POA: Diagnosis present

## 2015-04-08 DIAGNOSIS — Z952 Presence of prosthetic heart valve: Secondary | ICD-10-CM | POA: Diagnosis not present

## 2015-04-08 DIAGNOSIS — Z7951 Long term (current) use of inhaled steroids: Secondary | ICD-10-CM | POA: Insufficient documentation

## 2015-04-08 DIAGNOSIS — Z7982 Long term (current) use of aspirin: Secondary | ICD-10-CM | POA: Diagnosis not present

## 2015-04-08 DIAGNOSIS — E785 Hyperlipidemia, unspecified: Secondary | ICD-10-CM | POA: Diagnosis not present

## 2015-04-08 DIAGNOSIS — Z09 Encounter for follow-up examination after completed treatment for conditions other than malignant neoplasm: Secondary | ICD-10-CM | POA: Insufficient documentation

## 2015-04-08 DIAGNOSIS — Z87891 Personal history of nicotine dependence: Secondary | ICD-10-CM | POA: Diagnosis not present

## 2015-04-08 DIAGNOSIS — Z882 Allergy status to sulfonamides status: Secondary | ICD-10-CM | POA: Diagnosis not present

## 2015-04-08 DIAGNOSIS — Z880 Allergy status to penicillin: Secondary | ICD-10-CM | POA: Insufficient documentation

## 2015-04-08 DIAGNOSIS — Z951 Presence of aortocoronary bypass graft: Secondary | ICD-10-CM | POA: Diagnosis not present

## 2015-04-08 LAB — BLOOD GAS, ARTERIAL
Acid-Base Excess: 4.8 mmol/L — ABNORMAL HIGH (ref 0.0–3.0)
BICARBONATE: 29.9 meq/L — AB (ref 21.0–28.0)
FIO2: 0.21
O2 Saturation: 95.3 %
PH ART: 7.43 (ref 7.350–7.450)
PO2 ART: 75 mmHg — AB (ref 83.0–108.0)
Patient temperature: 37
pCO2 arterial: 45 mmHg (ref 32.0–48.0)

## 2015-04-08 LAB — BASIC METABOLIC PANEL
Anion gap: 11 (ref 5–15)
BUN: 34 mg/dL — AB (ref 6–20)
CALCIUM: 10 mg/dL (ref 8.9–10.3)
CO2: 27 mmol/L (ref 22–32)
CREATININE: 1.46 mg/dL — AB (ref 0.61–1.24)
Chloride: 98 mmol/L — ABNORMAL LOW (ref 101–111)
GFR calc Af Amer: 48 mL/min — ABNORMAL LOW (ref >60)
GFR calc non Af Amer: 42 mL/min — ABNORMAL LOW (ref >60)
GLUCOSE: 90 mg/dL (ref 65–99)
Potassium: 3.7 mmol/L (ref 3.5–5.1)
Sodium: 136 mmol/L (ref 135–145)

## 2015-04-08 LAB — CBC WITH DIFFERENTIAL/PLATELET
BASOS PCT: 1 %
Basophils Absolute: 0 10*3/uL (ref 0–0.1)
Eosinophils Absolute: 0.1 10*3/uL (ref 0–0.7)
Eosinophils Relative: 2 %
HEMATOCRIT: 41.9 % (ref 40.0–52.0)
Hemoglobin: 14 g/dL (ref 13.0–18.0)
LYMPHS PCT: 13 %
Lymphs Abs: 0.9 10*3/uL — ABNORMAL LOW (ref 1.0–3.6)
MCH: 31.7 pg (ref 26.0–34.0)
MCHC: 33.3 g/dL (ref 32.0–36.0)
MCV: 95 fL (ref 80.0–100.0)
MONO ABS: 0.9 10*3/uL (ref 0.2–1.0)
MONOS PCT: 13 %
NEUTROS ABS: 5.3 10*3/uL (ref 1.4–6.5)
Neutrophils Relative %: 73 %
Platelets: 112 10*3/uL — ABNORMAL LOW (ref 150–440)
RBC: 4.41 MIL/uL (ref 4.40–5.90)
RDW: 13.5 % (ref 11.5–14.5)
WBC: 7.4 10*3/uL (ref 3.8–10.6)

## 2015-04-08 LAB — CARBOXYHEMOGLOBIN
Carboxyhemoglobin: 1.9 % (ref 1.5–9.0)
Methemoglobin: 1.1 %
O2 Saturation: 92.9 %
TOTAL OXYGEN CONTENT: 90.2 mL/dL

## 2015-04-08 LAB — TROPONIN I: Troponin I: <0.03 ng/mL (ref <0.031)

## 2015-04-08 MED ORDER — ASPIRIN 81 MG PO CHEW
324.0000 mg | CHEWABLE_TABLET | Freq: Once | ORAL | Status: AC
  Administered 2015-04-08: 324 mg via ORAL
  Filled 2015-04-08: qty 4

## 2015-04-08 NOTE — ED Notes (Signed)
Pt reports a "light headache" for several days. Today pt beacme confused. Pt now reports feeling better, no neuro deficits but slight expressive aphasia noted.

## 2015-04-08 NOTE — ED Provider Notes (Signed)
[facility] Emergency Department Provider Note    ____________________________________________  Time seen: ~2010  I have reviewed the triage vital signs and the nursing notes.   HISTORY  Chief Complaint Altered Mental Status   History limited by: Not Limited   HPI Jimmy Friedman is a 82 y.o. male who has been complaining of difficulty with speech. The patient actually developed this as he was driving to the emergency department. He was coming to the emergency department because his wife had been transported here by EMS. States on his drive he started realizing he was having a hard time getting the words out that he meant to say. By the time of my examination however he states he feels like he had gotten better although was not perfectly normal. He has had a slight headache for a couple of days. Denies any recent fevers, chest pain or palpitations.    Past Medical History  Diagnosis Date  . Coronary artery disease   . MI (myocardial infarction) ([place])   . S/P AVR (aortic valve replacement)   . Prostate cancer ([place])   . Kidney problem   . Hypertension   . Hyperlipidemia   . Chronic prostatitis     Patient Active Problem List   Diagnosis Date Noted  . Chronic cough 03/02/2015  . Prostate cancer ([place]) 08/19/2014  . Chronic prostatitis 08/18/2014  . Coronary artery disease involving coronary bypass graft of native heart without angina pectoris 08/04/2014  . S/P CABG x 3 08/04/2014  . Hyperlipidemia 08/04/2014  . Essential hypertension 08/04/2014  . S/P aortic valve replacement with bioprosthetic valve 08/04/2014  . Thoracic aorta atherosclerosis ([place]) 08/04/2014    Past Surgical History  Procedure Laterality Date  . Coronary artery bypass graft    . Aortic valve replacement    . Kidney surgery      Current Outpatient Rx  Name  Route  Sig  Dispense  Refill  . aspirin 81 MG tablet   Oral   Take 81 mg by mouth daily.         Marland Kitchen  CALCIUM-MAGNESIUM-ZINC PO   Oral   Take by mouth daily.         . carvedilol (COREG) 6.25 MG tablet   Oral   Take 1 tablet (6.25 mg total) by mouth 2 (two) times daily.   180 tablet   3   . Cholecalciferol (VITAMIN D3) 2000 UNITS TABS   Oral   Take 2,000 mg by mouth daily.         . clindamycin (CLEOCIN) 150 MG capsule   Oral   Take 4 capsules (600 mg total) by mouth daily as needed. One hour before dental treatments.   24 capsule   1   . clotrimazole-betamethasone (LOTRISONE) cream   Topical   Apply 1 application topically 2 (two) times daily.   30 g   0   . doxycycline (VIBRA-TABS) 100 MG tablet      TAKE 1 TABLET BY MOUTH DAILY   30 tablet   0   . fluticasone (FLOVENT HFA) 220 MCG/ACT inhaler   Inhalation   Inhale into the lungs.         . furosemide (LASIX) 20 MG tablet   Oral   Take 1 tablet (20 mg total) by mouth daily.   5 tablet   0   . Glucosamine-Chondroitin 500-400 MG CAPS   Oral   Take by mouth 2 (two) times daily.         Marland Kitchen  losartan (COZAAR) 25 MG tablet   Oral   Take 1 tablet (25 mg total) by mouth daily.   90 tablet   3   . Multiple Vitamin (MULTIVITAMIN) tablet   Oral   Take 1 tablet by mouth daily.         Marland Kitchen oxyCODONE-acetaminophen (PERCOCET/ROXICET) 5-325 MG tablet   Oral   Take 1 tablet by mouth every 4 (four) hours as needed for severe pain.         . pravastatin (PRAVACHOL) 40 MG tablet      TAKE 1 TABLET EACH DAY   30 tablet   1   . vitamin C (ASCORBIC ACID) 500 MG tablet   Oral   Take 500 mg by mouth daily.         Marland Kitchen VITAMIN E PO   Oral   Take by mouth daily.           Allergies Penicillins and Sulfamethoxazole-trimethoprim  Family History  Problem Relation Age of Onset  . Heart Problems Mother   . Osteoporosis Mother   . Heart Problems Father   . Hypertension Father   . Heart Problems Son     Social History Social History  Substance Use Topics  . Smoking status: Never Smoker   .  Smokeless tobacco: Never Used  . Alcohol Use: No    Review of Systems  Constitutional: Negative for fever. Cardiovascular: Negative for chest pain. Respiratory: Negative for shortness of breath. Gastrointestinal: Negative for abdominal pain, vomiting and diarrhea. Neurological: Positive for headache. Positive for difficulty with speech.   10-point ROS otherwise negative.  ____________________________________________   PHYSICAL EXAM:  VITAL SIGNS: ED Triage Vitals  Enc Vitals Group     BP 04/08/15 1916 148/75 mmHg     Pulse Rate 04/08/15 1916 89     Resp 04/08/15 1916 18     Temp 04/08/15 1916 98.1 F (36.7 C)     Temp Source 04/08/15 1916 Oral     SpO2 04/08/15 1916 95 %     Weight --      Height --      Head Cir --      Peak Flow --      Pain Score 04/08/15 1916 0   Constitutional: Alert and oriented. Well appearing and in no distress. Eyes: Conjunctivae are normal. PERRL. Normal extraocular movements. ENT   Head: Normocephalic and atraumatic.   Nose: No congestion/rhinnorhea.   Mouth/Throat: Mucous membranes are moist.   Neck: No stridor. Hematological/Lymphatic/Immunilogical: No cervical lymphadenopathy. Cardiovascular: Normal rate, regular rhythm.  No murmurs, rubs, or gallops. Respiratory: Normal respiratory effort without tachypnea nor retractions. Breath sounds are clear and equal bilaterally. No wheezes/rales/rhonchi. Gastrointestinal: Soft and nontender. No distention. Genitourinary: Deferred Musculoskeletal: Normal range of motion in all extremities. No joint effusions.  No lower extremity tenderness nor edema. Neurologic:  Some expressive aphasia noted. No upper or lower extremity weakness. Face symmetric. Tongue midline. Sensation grossly intact. NIHSS of 1 Skin:  Skin is warm, dry and intact. No rash noted. Psychiatric: Mood and affect are normal. Speech and behavior are normal. Patient exhibits appropriate insight and  judgment.  ____________________________________________    LABS (pertinent positives/negatives)  Labs Reviewed  CBC WITH DIFFERENTIAL/PLATELET - Abnormal; Notable for the following:    Platelets 112 (*)    Lymphs Abs 0.9 (*)    All other components within normal limits  BASIC METABOLIC PANEL - Abnormal; Notable for the following:    Chloride 98 (*)  BUN 34 (*)    Creatinine, Ser 1.46 (*)    GFR calc non Af Amer 42 (*)    GFR calc Af Amer 48 (*)    All other components within normal limits  TROPONIN I  URINALYSIS COMPLETEWITH MICROSCOPIC ([facility] ONLY)     ____________________________________________   EKG  I, Nance Pear, attending physician, personally viewed and interpreted this EKG  EKG Time: 1934 Rate: 74 Rhythm: normal sinus rhythm Axis: left axis deviation Intervals: qtc 482 QRS: RBBB, LAFB, LVH ST changes: no st elevation Impression: abnormal ekg ____________________________________________    RADIOLOGY  CT head  IMPRESSION: Normal unenhanced CT scan of the brain for age.  ____________________________________________   PROCEDURES  Procedure(s) performed: None  Critical Care performed: Yes, see critical care note(s)  CRITICAL CARE Performed by: Nance Pear   Total critical care time: 61minutes  Critical care time was exclusive of separately billable procedures and treating other patients.  Critical care was necessary to treat or prevent imminent or life-threatening deterioration.  Critical care was time spent personally by me on the following activities: development of treatment plan with patient and/or surrogate as well as nursing, discussions with consultants, evaluation of patient's response to treatment, examination of patient, obtaining history from patient or surrogate, ordering and performing treatments and interventions, ordering and review of laboratory studies, ordering and review of radiographic studies, pulse oximetry and  re-evaluation of patient's condition.  ____________________________________________   INITIAL IMPRESSION / ASSESSMENT AND PLAN / ED COURSE  Pertinent labs & imaging results that were available during my care of the patient were reviewed by me and considered in my medical decision making (see chart for details).  Patient evaluated in the emergency department today because of concerns for expressive aphasia. On my exam patient had some expressive aphasia although states he had been getting better. I did have neurology see patient which stated he was back to normal and did not recommend TPA. Head CT did not show any acute findings. Blood work without any concerning findings. Will plan on admitting patient to hospitalist service for further workup and evaluation.  ____________________________________________   FINAL CLINICAL IMPRESSION(S) / ED DIAGNOSES  Final diagnoses:  Transient cerebral ischemia, unspecified transient cerebral ischemia type     Nance Pear, MD 04/08/15 [phone]

## 2015-04-08 NOTE — ED Notes (Signed)
Pt presents to STAT desk with possible confusion/AMS. Pt arrived to ED approx 15 after his wife arrived to ED via ems for similar symptoms. Pt states he is not sure who called EMS for his wife and is not sure if he came to desk to see his wife to to be checked in. Pt does state he drove himself here. Unable to answer questions. When asked his name in triage room pt attempted to answer and then after several minutes pt said "I dont know". Pt has no increased work of breathing or acute distress noted. Flat affect.

## 2015-04-09 ENCOUNTER — Encounter: Payer: Self-pay | Admitting: *Deleted

## 2015-04-09 ENCOUNTER — Observation Stay: Payer: Medicare Other

## 2015-04-09 DIAGNOSIS — G459 Transient cerebral ischemic attack, unspecified: Secondary | ICD-10-CM | POA: Diagnosis not present

## 2015-04-09 LAB — URINALYSIS COMPLETE WITH MICROSCOPIC (ARMC ONLY)
BILIRUBIN URINE: NEGATIVE
Bacteria, UA: NONE SEEN
GLUCOSE, UA: NEGATIVE mg/dL
HGB URINE DIPSTICK: NEGATIVE
LEUKOCYTES UA: NEGATIVE
NITRITE: NEGATIVE
PH: 6 (ref 5.0–8.0)
Protein, ur: NEGATIVE mg/dL
RBC / HPF: NONE SEEN RBC/hpf (ref 0–5)
SPECIFIC GRAVITY, URINE: 1.005 (ref 1.005–1.030)
Squamous Epithelial / LPF: NONE SEEN

## 2015-04-09 LAB — CBC
HEMATOCRIT: 40.3 % (ref 40.0–52.0)
Hemoglobin: 13.7 g/dL (ref 13.0–18.0)
MCH: 32.7 pg (ref 26.0–34.0)
MCHC: 33.9 g/dL (ref 32.0–36.0)
MCV: 96.4 fL (ref 80.0–100.0)
Platelets: 104 10*3/uL — ABNORMAL LOW (ref 150–440)
RBC: 4.18 MIL/uL — AB (ref 4.40–5.90)
RDW: 13.4 % (ref 11.5–14.5)
WBC: 7.2 10*3/uL (ref 3.8–10.6)

## 2015-04-09 LAB — LIPID PANEL
Cholesterol: 139 mg/dL (ref 0–200)
HDL: 41 mg/dL (ref >40)
LDL Cholesterol: 86 mg/dL (ref 0–99)
Total CHOL/HDL Ratio: 3.4 RATIO
Triglycerides: 62 mg/dL (ref <150)
VLDL: 12 mg/dL (ref 0–40)

## 2015-04-09 LAB — CREATININE, SERUM
Creatinine, Ser: 1.5 mg/dL — ABNORMAL HIGH (ref 0.61–1.24)
GFR calc non Af Amer: 40 mL/min — ABNORMAL LOW (ref >60)
GFR, EST AFRICAN AMERICAN: 47 mL/min — AB (ref >60)

## 2015-04-09 MED ORDER — CARVEDILOL 6.25 MG PO TABS: 6.2500 mg | ORAL_TABLET | Freq: Two times a day (BID) | ORAL | Status: DC

## 2015-04-09 MED ORDER — FLUTICASONE PROPIONATE HFA 220 MCG/ACT IN AERO: 1.0000 | INHALATION_SPRAY | Freq: Two times a day (BID) | RESPIRATORY_TRACT | Status: DC

## 2015-04-09 MED ORDER — VITAMIN E 45 MG (100 UNIT) PO CAPS
100.0000 [IU] | ORAL_CAPSULE | Freq: Every day | ORAL | Status: DC
  Filled 2015-04-09 (×2): qty 1

## 2015-04-09 MED ORDER — SENNOSIDES-DOCUSATE SODIUM 8.6-50 MG PO TABS: 1.0000 | ORAL_TABLET | Freq: Every evening | ORAL | Status: DC | PRN

## 2015-04-09 MED ORDER — VITAMIN C 500 MG PO TABS
500.0000 mg | ORAL_TABLET | Freq: Every day | ORAL | Status: DC
  Administered 2015-04-09: 500 mg via ORAL
  Filled 2015-04-09: qty 1

## 2015-04-09 MED ORDER — LOSARTAN POTASSIUM 25 MG PO TABS
25.0000 mg | ORAL_TABLET | Freq: Every day | ORAL | Status: DC
  Administered 2015-04-09: 25 mg via ORAL
  Filled 2015-04-09: qty 1

## 2015-04-09 MED ORDER — VITAMIN D 1000 UNITS PO TABS
2000.0000 [IU] | ORAL_TABLET | Freq: Every day | ORAL | Status: DC
  Administered 2015-04-09: 2000 [IU] via ORAL
  Filled 2015-04-09: qty 2

## 2015-04-09 MED ORDER — ASPIRIN 325 MG PO TABS
325.0000 mg | ORAL_TABLET | Freq: Every day | ORAL | Status: DC
  Administered 2015-04-09: 325 mg via ORAL
  Filled 2015-04-09: qty 1

## 2015-04-09 MED ORDER — ENOXAPARIN SODIUM 40 MG/0.4ML ~~LOC~~ SOLN
40.0000 mg | Freq: Every day | SUBCUTANEOUS | Status: DC
  Administered 2015-04-09: 40 mg via SUBCUTANEOUS
  Filled 2015-04-09: qty 0.4

## 2015-04-09 MED ORDER — BUDESONIDE 0.5 MG/2ML IN SUSP
0.5000 mg | Freq: Two times a day (BID) | RESPIRATORY_TRACT | Status: DC
  Administered 2015-04-09: 0.5 mg via RESPIRATORY_TRACT
  Filled 2015-04-09: qty 2

## 2015-04-09 MED ORDER — STROKE: EARLY STAGES OF RECOVERY BOOK
Freq: Once | Status: AC
  Administered 2015-04-09: 02:00:00

## 2015-04-09 MED ORDER — CARVEDILOL 6.25 MG PO TABS
6.2500 mg | ORAL_TABLET | Freq: Two times a day (BID) | ORAL | Status: DC
  Administered 2015-04-09: 6.25 mg via ORAL
  Filled 2015-04-09 (×2): qty 1

## 2015-04-09 MED ORDER — ADULT MULTIVITAMIN W/MINERALS CH: 1.0000 | ORAL_TABLET | Freq: Every day | ORAL | Status: DC

## 2015-04-09 MED ORDER — ENOXAPARIN SODIUM 40 MG/0.4ML ~~LOC~~ SOLN: 40.0000 mg | SUBCUTANEOUS | Status: DC

## 2015-04-09 MED ORDER — PRAVASTATIN SODIUM 20 MG PO TABS
40.0000 mg | ORAL_TABLET | Freq: Every day | ORAL | Status: DC
  Filled 2015-04-09: qty 2

## 2015-04-09 MED ORDER — ASPIRIN 300 MG RE SUPP: 300.0000 mg | Freq: Every day | RECTAL | Status: DC

## 2015-04-09 NOTE — H&P (Signed)
[facility] [place] NAME: Jimmy Friedman    MR#:  [id number]  DATE OF BIRTH:  02-23-1927  DATE OF ADMISSION:  04/08/2015  PRIMARY CARE PHYSICIAN: Kathrine Haddock, NP   REQUESTING/REFERRING PHYSICIAN:   CHIEF COMPLAINT:   Chief Complaint  Patient presents with  . Altered Mental Status    HISTORY OF PRESENT ILLNESS: Jimmy Friedman  is a 82 y.o. male with a known history of coronary artery disease, myocardial infarction, prostate cancer, hypertension, hyperlipidemia came to visit the hospital for his wife has been admitted. He felt he couldn't speak suddenly and he was registered in the emergency room for evaluation of stroke. Patient also complains of some tingling sensation in the right hand. No history of any numbness or any weakness in any part of the body. Patient was worked up with a CT head which showed no acute intracranial abnormality. Telemetry neurology consult was done who recommended MRI brain start patient on aspirin. Patient's speech is much better later on and he did not have any trouble articulate words . No history of chest pain or shortness of breath. Patient is completely alert awake oriented time place and person and moves all extremities. Patient says they have 3 panel natural heater at home, and probably his confusion and present symptoms could be secondary to carbon monoxide elevation at home.  PAST MEDICAL HISTORY:   Past Medical History  Diagnosis Date  . Coronary artery disease   . MI (myocardial infarction) ([place])   . S/P AVR (aortic valve replacement)   . Prostate cancer ([place])   . Kidney problem   . Hypertension   . Hyperlipidemia   . Chronic prostatitis     PAST SURGICAL HISTORY: Past Surgical History  Procedure Laterality Date  . Coronary artery bypass graft    . Aortic valve replacement    . Kidney surgery      SOCIAL HISTORY:  Social History  Substance Use Topics  . Smoking status: Former  Research scientist (life sciences)  . Smokeless tobacco: Never Used  . Alcohol Use: 0.0 oz/week    0 Standard drinks or equivalent per week     Comment: beer once a month    FAMILY HISTORY:  Family History  Problem Relation Age of Onset  . Heart Problems Mother   . Osteoporosis Mother   . Heart Problems Father   . Hypertension Father   . Heart Problems Son     DRUG ALLERGIES:  Allergies  Allergen Reactions  . Penicillins     Other reaction(s): Unknown  . Sulfamethoxazole-Trimethoprim     Other reaction(s): Unknown    REVIEW OF SYSTEMS:   CONSTITUTIONAL: No fever, fatigue or weakness.  EYES: No blurred or double vision.  EARS, NOSE, AND THROAT: No tinnitus or ear pain.  RESPIRATORY: No cough, shortness of breath, wheezing or hemoptysis.  CARDIOVASCULAR: No chest pain, orthopnea, edema.  GASTROINTESTINAL: No nausea, vomiting, diarrhea or abdominal pain.  GENITOURINARY: No dysuria, hematuria.  ENDOCRINE: No polyuria, nocturia,  HEMATOLOGY: No anemia, easy bruising or bleeding SKIN: No rash or lesion. MUSCULOSKELETAL: No joint pain or arthritis.   NEUROLOGIC:  Had trouble in speech initially.Tingling sensation right hand,no numbness or weakness in any part of the body.  PSYCHIATRY: No anxiety or depression.   MEDICATIONS AT HOME:  Prior to Admission medications   Medication Sig Start Date End Date Taking? Authorizing Provider  aspirin 81 MG tablet Take 81 mg by mouth daily.   Yes Historical Provider,  MD  CALCIUM-MAGNESIUM-ZINC PO Take by mouth daily.   Yes Historical Provider, MD  carvedilol (COREG) 6.25 MG tablet Take 1 tablet (6.25 mg total) by mouth 2 (two) times daily. 08/04/14  Yes Minna Merritts, MD  Cholecalciferol (VITAMIN D3) 2000 UNITS TABS Take 2,000 mg by mouth daily.   Yes Historical Provider, MD  clotrimazole-betamethasone (LOTRISONE) cream Apply 1 application topically 2 (two) times daily. 03/02/15  Yes Kathrine Haddock, NP  doxycycline (VIBRA-TABS) 100 MG tablet TAKE 1 TABLET BY MOUTH  DAILY 03/19/15  Yes Kathrine Haddock, NP  fluticasone (FLOVENT HFA) 220 MCG/ACT inhaler Inhale into the lungs. 08/11/14 08/11/15 Yes Historical Provider, MD  furosemide (LASIX) 20 MG tablet Take 1 tablet (20 mg total) by mouth daily. 04/05/15 04/10/16 Yes Daymon Larsen, MD  Glucosamine-Chondroitin 500-400 MG CAPS Take by mouth 2 (two) times daily.   Yes Historical Provider, MD  losartan (COZAAR) 25 MG tablet Take 1 tablet (25 mg total) by mouth daily. 08/04/14  Yes Minna Merritts, MD  Multiple Vitamin (MULTIVITAMIN) tablet Take 1 tablet by mouth daily.   Yes Historical Provider, MD  pravastatin (PRAVACHOL) 40 MG tablet TAKE 1 TABLET EACH DAY 08/27/14  Yes Guadalupe Maple, MD  vitamin C (ASCORBIC ACID) 500 MG tablet Take 500 mg by mouth daily.   Yes Historical Provider, MD  VITAMIN E PO Take by mouth daily.   Yes Historical Provider, MD      PHYSICAL EXAMINATION:   VITAL SIGNS: Blood pressure 191/85, pulse 70, temperature 98.6 F (37 C), temperature source Oral, resp. rate 17, SpO2 98 %.  GENERAL:  82 y.o.-year-old patient lying in the bed with no acute distress.  EYES: Pupils equal, round, reactive to light and accommodation. No scleral icterus. Extraocular muscles intact.  HEENT: Head atraumatic, normocephalic. Oropharynx and nasopharynx clear.  NECK:  Supple, no jugular venous distention. No thyroid enlargement, no tenderness.  LUNGS: Normal breath sounds bilaterally, no wheezing, rales,rhonchi or crepitation. No use of accessory muscles of respiration.  CARDIOVASCULAR: S1, S2 normal. No murmurs, rubs, or gallops.  ABDOMEN: Soft, nontender, nondistended. Bowel sounds present. No organomegaly or mass.  EXTREMITIES: No pedal edema, cyanosis, or clubbing.  NEUROLOGIC: Cranial nerves II through XII are intact. Muscle strength 5/5 in all extremities. Sensation intact. Gait normal. PSYCHIATRIC: The patient is alert and oriented x 3.  SKIN: No obvious rash, lesion, or ulcer.   LABORATORY PANEL:    CBC  Recent Labs Lab 04/04/15 1918 04/08/15 1955  WBC 8.5 7.4  HGB 13.6 14.0  HCT 40.4 41.9  PLT 116* 112*  MCV 95.6 95.0  MCH 32.1 31.7  MCHC 33.5 33.3  RDW 13.1 13.5  LYMPHSABS 1.0 0.9*  MONOABS 1.1* 0.9  EOSABS 0.2 0.1  BASOSABS 0.1 0.0   ------------------------------------------------------------------------------------------------------------------  Chemistries   Recent Labs Lab 04/04/15 1918 04/08/15 1955  NA 137 136  K 3.5 3.7  CL 103 98*  CO2 28 27  GLUCOSE 92 90  BUN 26* 34*  CREATININE 1.44* 1.46*  CALCIUM 10.1 10.0  AST 27  --   ALT 19  --   ALKPHOS 38  --   BILITOT 1.3*  --    ------------------------------------------------------------------------------------------------------------------ estimated creatinine clearance is 36.4 mL/min (by C-G formula based on Cr of 1.46). ------------------------------------------------------------------------------------------------------------------ No results for input(s): TSH, T4TOTAL, T3FREE, THYROIDAB in the last 72 hours.  Invalid input(s): FREET3   Coagulation profile No results for input(s): INR, PROTIME in the last 168 hours. ------------------------------------------------------------------------------------------------------------------- No results for input(s): DDIMER in  the last 72 hours. -------------------------------------------------------------------------------------------------------------------  Cardiac Enzymes  Recent Labs Lab 04/04/15 1918 04/08/15 1917  TROPONINI 0.03 <0.03   ------------------------------------------------------------------------------------------------------------------ Invalid input(s): POCBNP  ---------------------------------------------------------------------------------------------------------------  Urinalysis    Component Value Date/Time   APPEARANCEUR Clear 01/12/2015 1342   GLUCOSEU Negative 01/12/2015 1342   BILIRUBINUR Negative 01/12/2015  1342   PROTEINUR 1+* 01/12/2015 1342   NITRITE Negative 01/12/2015 1342   LEUKOCYTESUR Negative 01/12/2015 1342     RADIOLOGY: Ct Head Wo Contrast  04/08/2015  CLINICAL DATA:  Confusion/altered mental status. EXAM: CT HEAD WITHOUT CONTRAST TECHNIQUE: Contiguous axial images were obtained from the base of the skull through the vertex without intravenous contrast. COMPARISON:  None. FINDINGS: The ventricles are normal in size, for this patient's age, and normal in configuration. There are no parenchymal masses or mass effect. There is no evidence of a cortical infarct. There are no extra-axial masses or abnormal fluid collections. There is no intracranial hemorrhage. The visualized sinuses and mastoid air cells are clear. IMPRESSION: Normal unenhanced CT scan of the brain for age. Electronically Signed   By: Lajean Manes M.D.   On: 04/08/2015 20:52    EKG: Orders placed or performed during the hospital encounter of 04/08/15  . ED EKG  . ED EKG    IMPRESSION AND PLAN: 82 year old male patient with history of prostate cancer, hypertension, hyperlipidemia, coronary artery disease presented to the emergency room with difficulty in speech which has resolved in the emergency room. Admitting diagnosis 1. Transient ischemic attack 2. Hypertension 3. Hyperlipidemia 4. Coronary artery disease Treatment plan : Admit patient to telemetry observation bed Start patient on aspirin MRI brain to rule out CVA, carotid ultrasound to rule out obstruction Check echocardiogram Telemetry monitoring Supportive care.   All the records are reviewed and case discussed with ED provider. Management plans discussed with the patient, family and they are in agreement.  CODE STATUS:FULL Code Status History    This patient does not have a recorded code status. Please follow your organizational policy for patients in this situation.       TOTAL TIME TAKING CARE OF THIS PATIENT: 50 minutes.    Saundra Shelling M.D on 04/09/2015 at 12:04 AM  Between 7am to 6pm - Pager - [phone]  After 6pm go to [email or web address] - password [email or web address] [facility]  Office  [phone]  CC: Primary care physician; Kathrine Haddock, NP

## 2015-04-09 NOTE — Progress Notes (Signed)
PT Cancellation Note  Patient Details Name: Jimmy Friedman MRN: [id number] DOB: 1927-03-23   Cancelled Treatment:    Reason Eval/Treat Not Completed: Patient at procedure or test/unavailable    Kreg Shropshire 04/09/2015, 1:09 PM

## 2015-04-09 NOTE — Consult Note (Signed)
   [facility] CM Inpatient Consult   04/09/2015  LEONDRO CORYELL 1927-04-10 [id number]   Met with the patient, spouse and son regarding the benefits of [facility] Care Management services both husband and wife are inpatient and are eligible. Explained that [facility] is a covered benefit of his Medicare insurance. Reviewed information for [facility] Care Management and a folder was provided with contact information.  Explained that [facility] does not interfere with or replace any services arranged by the inpatient care management staff. Patient and spouse requested a return visit related to spouse stating "we have a lot going on right now." Made patient and spouse aware that even if discharged before [facility] can make a return visit, they may call [facility]-office or Hospital Liaison's contact information to sign up with [facility]. For questions please contact:  Shakai Dolley RN, [facility] Liaison  [phone]) Business Mobile [phone]) Toll free office

## 2015-04-09 NOTE — Progress Notes (Addendum)
Speech Therapy Note: received order, reviewed chart notes and consulted NSG re: pt's status this morning. NSG denied any noted difficulty w/ speech and swallowing. Met w/ pt who reported his speech returned, and he was able to articulate and converse w/out difficulty while in the ED; pt was conversive(appropriately) w/ SLP. Also, pt denied any trouble swallowing(he consumed sips of juice while in room w/out deficits noted).  Pt indicated he did not feel the need for an evaluation at this time as he feels he is at his baseline. ST will sign off at his time w/ NSG to reconsult if any change in status while admitted. Pt agreed.

## 2015-04-09 NOTE — Care Management Obs Status (Signed)
[facility] NOTIFICATION   Patient Details  Name: BUZZ VITULLI MRN: [id number] Date of Birth: Jul 25, 1927   Medicare Observation Status Notification Given:  Yes    Katrina Stack, RN 04/09/2015, 11:30 AM

## 2015-04-09 NOTE — Evaluation (Signed)
Physical Therapy Evaluation Patient Details Name: Jimmy Friedman MRN: [id number] DOB: 08/09/1927 Today's Date: 04/09/2015   History of Present Illness  82 y/o male who is normally very active and independent had stroke-like event with inability to speak and possibly some R hand numbness.  At time of PT eval he is speaking normally and phyiscally is at his baseline.   Clinical Impression  Pt does well with PT and has no safety issues t/o session.  He had a (-) MRI feels he is back at his baseline.  Pt with no needs and shows good confidence with all activites.     Follow Up Recommendations No PT follow up    Equipment Recommendations       Recommendations for Other Services       Precautions / Restrictions Precautions Precautions: None Restrictions Weight Bearing Restrictions: No      Mobility  Bed Mobility Overal bed mobility: Independent                Transfers Overall transfer level: Independent Equipment used: None             General transfer comment:  (Pt. education was thoroughly provided about the use of gait belts for safety when assessing mobility.)  Ambulation/Gait Ambulation/Gait assistance: Independent Ambulation Distance (Feet): 250 Feet Assistive device: None       General Gait Details: Pt able to walk with good speed and confidence.  He is at his baseline and shows no safety issues.   Stairs Stairs: Yes Stairs assistance: Independent Stair Management: One rail Right Number of Stairs: 6    Wheelchair Mobility    Modified Rankin (Stroke Patients Only)       Balance                                             Pertinent Vitals/Pain Pain Assessment: No/denies pain    Home Living Family/patient expects to be discharged to:: Private residence Living Arrangements: Spouse/significant other Available Help at Discharge: Family Type of Home: House Home Access: Stairs to enter Entrance Stairs-Rails:  Psychiatric nurse of Steps: 4 Home Layout: One level        Prior Function Level of Independence: Independent         Comments: Pt. was Independent with ADls/IADLs , working fulltime, driving ,and Comptroller.     Hand Dominance   Dominant Hand: Right    Extremity/Trunk Assessment   Upper Extremity Assessment: Overall WFL for tasks assessed           Lower Extremity Assessment: Overall WFL for tasks assessed         Communication   Communication: No difficulties  Cognition Arousal/Alertness: Awake/alert Behavior During Therapy: WFL for tasks assessed/performed Overall Cognitive Status: Within Functional Limits for tasks assessed                      General Comments General comments (skin integrity, edema, etc.): No LOB noted    Exercises        Assessment/Plan    PT Assessment Patent does not need any further PT services  PT Diagnosis Difficulty walking   PT Problem List    PT Treatment Interventions     PT Goals (Current goals can be found in the Care Plan section) Acute Rehab PT Goals Patient Stated Goal: Go home this afternoon.  Frequency     Barriers to discharge        Co-evaluation               End of Session Equipment Utilized During Treatment: Gait belt Activity Tolerance: Patient tolerated treatment well Patient left: in bed;with family/visitor present      Functional Assessment Tool Used: clinical judgement Functional Limitation: Mobility: Walking and moving around Mobility: Walking and Moving Around Current Status [phone]): 0 percent impaired, limited or restricted Mobility: Walking and Moving Around Goal Status [phone]): 0 percent impaired, limited or restricted Mobility: Walking and Moving Around Discharge Status [phone]): 0 percent impaired, limited or restricted    Time: 1340-1356 PT Time Calculation (min) (ACUTE ONLY): 16 min   Charges:   PT Evaluation $PT Eval Low Complexity: 1  Procedure     PT G Codes:   PT G-Codes **NOT FOR INPATIENT CLASS** Functional Assessment Tool Used: clinical judgement Functional Limitation: Mobility: Walking and moving around Mobility: Walking and Moving Around Current Status [id number]): 0 percent impaired, limited or restricted Mobility: Walking and Moving Around Goal Status [phone]): 0 percent impaired, limited or restricted Mobility: Walking and Moving Around Discharge Status [phone]): 0 percent impaired, limited or restricted   Jimmy Friedman, PT, DPT [phone]  Kreg Shropshire 04/09/2015, 2:41 PM

## 2015-04-09 NOTE — Discharge Summary (Signed)
[facility] NAME: Jimmy Friedman    MR#:  [id number]  DATE OF BIRTH:  1927-02-14  DATE OF ADMISSION:  04/08/2015 ADMITTING PHYSICIAN: Saundra Shelling, MD  DATE OF DISCHARGE: 04/09/2015  PRIMARY CARE PHYSICIAN: Kathrine Haddock, NP    ADMISSION DIAGNOSIS:  TIA (transient ischemic attack) [G45.9] Transient cerebral ischemia, unspecified transient cerebral ischemia type [G45.9]  DISCHARGE DIAGNOSIS:  Principal Problem:   TIA (transient ischemic attack) resolved  SECONDARY DIAGNOSIS:   Past Medical History  Diagnosis Date  . Coronary artery disease   . MI (myocardial infarction) ([place])   . S/P AVR (aortic valve replacement)   . Prostate cancer ([place])   . Kidney problem   . Hypertension   . Hyperlipidemia   . Chronic prostatitis     HOSPITAL COURSE:  Jimmy Friedman  is a 82 y.o. male admitted 04/08/2015 with chief complaint Altered Mental Status . Please see H&P performed by Saundra Shelling, MD for further information. Admitted with difficulty with speech, after bring his wife to the emergency department. He was noted to have elevated blood pressure on admission. Given symptoms or is concern for TIA. Fortunately on day of discharge symptoms have completely resolved his back at baseline and actually visiting his wife at her bedside. Workup has been negative/unrevealing thus far. Of note his cholesterol is a mildly elevated LDL in the low 80 range he is already on statin therapy, continue aspirin therapy blood pressure better controlled since admission. Patient did not receive TPA given duration of symptoms and severity of symptoms.  DISCHARGE CONDITIONS:   Improved  CONSULTS OBTAINED:     DRUG ALLERGIES:   Allergies  Allergen Reactions  . Penicillins     Other reaction(s): Unknown  . Sulfamethoxazole-Trimethoprim     Other reaction(s): Unknown    DISCHARGE MEDICATIONS:   Current Discharge Medication List      CONTINUE these medications which have NOT CHANGED   Details  aspirin 81 MG tablet Take 81 mg by mouth daily.    CALCIUM-MAGNESIUM-ZINC PO Take by mouth daily.    carvedilol (COREG) 6.25 MG tablet Take 1 tablet (6.25 mg total) by mouth 2 (two) times daily. Qty: 180 tablet, Refills: 3    Cholecalciferol (VITAMIN D3) 2000 UNITS TABS Take 2,000 mg by mouth daily.    clotrimazole-betamethasone (LOTRISONE) cream Apply 1 application topically 2 (two) times daily. Qty: 30 g, Refills: 0    fluticasone (FLOVENT HFA) 220 MCG/ACT inhaler Inhale into the lungs.    furosemide (LASIX) 20 MG tablet Take 1 tablet (20 mg total) by mouth daily. Qty: 5 tablet, Refills: 0    Glucosamine-Chondroitin 500-400 MG CAPS Take by mouth 2 (two) times daily.    losartan (COZAAR) 25 MG tablet Take 1 tablet (25 mg total) by mouth daily. Qty: 90 tablet, Refills: 3    Multiple Vitamin (MULTIVITAMIN) tablet Take 1 tablet by mouth daily.    pravastatin (PRAVACHOL) 40 MG tablet TAKE 1 TABLET EACH DAY Qty: 30 tablet, Refills: 1    vitamin C (ASCORBIC ACID) 500 MG tablet Take 500 mg by mouth daily.    VITAMIN E PO Take by mouth daily.      STOP taking these medications     doxycycline (VIBRA-TABS) 100 MG tablet          DISCHARGE INSTRUCTIONS:    DIET:  Cardiac diet  DISCHARGE CONDITION:  Good  ACTIVITY:  Activity as tolerated  OXYGEN:  Home Oxygen: No.  Oxygen Delivery: room air  DISCHARGE LOCATION:  home   If you experience worsening of your admission symptoms, develop shortness of breath, life threatening emergency, suicidal or homicidal thoughts you must seek medical attention immediately by calling 911 or calling your MD immediately  if symptoms less severe.  You Must read complete instructions/literature along with all the possible adverse reactions/side effects for all the Medicines you take and that have been prescribed to you. Take any new Medicines after you have completely  understood and accpet all the possible adverse reactions/side effects.   Please note  You were cared for by a hospitalist during your hospital stay. If you have any questions about your discharge medications or the care you received while you were in the hospital after you are discharged, you can call the unit and asked to speak with the hospitalist on call if the hospitalist that took care of you is not available. Once you are discharged, your primary care physician will handle any further medical issues. Please note that NO REFILLS for any discharge medications will be authorized once you are discharged, as it is imperative that you return to your primary care physician (or establish a relationship with a primary care physician if you do not have one) for your aftercare needs so that they can reassess your need for medications and monitor your lab values.    On the day of Discharge:   VITAL SIGNS:  Blood pressure 144/59, pulse 72, temperature 97.5 F (36.4 C), temperature source Oral, resp. rate 20, height 5\' 10"  (1.778 m), weight 68.493 kg (151 lb), SpO2 99 %.  I/O:   Intake/Output Summary (Last 24 hours) at 04/09/15 1439 Last data filed at 04/09/15 0830  Gross per 24 hour  Intake    120 ml  Output    400 ml  Net   -280 ml    PHYSICAL EXAMINATION:  GENERAL:  82 y.o.-year-old patient lying in the bed with no acute distress.  EYES: Pupils equal, round, reactive to light and accommodation. No scleral icterus. Extraocular muscles intact.  HEENT: Head atraumatic, normocephalic. Oropharynx and nasopharynx clear.  NECK:  Supple, no jugular venous distention. No thyroid enlargement, no tenderness.  LUNGS: Normal breath sounds bilaterally, no wheezing, rales,rhonchi or crepitation. No use of accessory muscles of respiration.  CARDIOVASCULAR: S1, S2 normal. No murmurs, rubs, or gallops.  ABDOMEN: Soft, non-tender, non-distended. Bowel sounds present. No organomegaly or mass.  EXTREMITIES:  No pedal edema, cyanosis, or clubbing.  NEUROLOGIC: Cranial nerves II through XII are intact. Muscle strength 5/5 in all extremities. Sensation intact. Gait not checked.  PSYCHIATRIC: The patient is alert and oriented x 3.  SKIN: No obvious rash, lesion, or ulcer.   DATA REVIEW:   CBC  Recent Labs Lab 04/09/15 0249  WBC 7.2  HGB 13.7  HCT 40.3  PLT 104*    Chemistries   Recent Labs Lab 04/04/15 1918 04/08/15 1955 04/09/15 0249  NA 137 136  --   K 3.5 3.7  --   CL 103 98*  --   CO2 28 27  --   GLUCOSE 92 90  --   BUN 26* 34*  --   CREATININE 1.44* 1.46* 1.50*  CALCIUM 10.1 10.0  --   AST 27  --   --   ALT 19  --   --   ALKPHOS 38  --   --   BILITOT 1.3*  --   --     Cardiac Enzymes  Recent  Labs Lab 04/08/15 [place] <0.03    Microbiology Results  Results for orders placed or performed in visit on 01/12/15  Microscopic Examination     Status: Abnormal   Collection Time: 01/12/15  1:42 PM  Result Value Ref Range Status   WBC, UA None seen 0 -  5 /hpf Final   RBC, UA None seen 0 -  2 /hpf Final   Epithelial Cells (non renal) 0-10 0 - 10 /hpf Final   Renal Epithel, UA 0-10 (A) None seen /hpf Final   Casts Present None seen /lpf Final   Cast Type Granular casts N/A Final    RADIOLOGY:  Ct Head Wo Contrast  04/08/2015  CLINICAL DATA:  Confusion/altered mental status. EXAM: CT HEAD WITHOUT CONTRAST TECHNIQUE: Contiguous axial images were obtained from the base of the skull through the vertex without intravenous contrast. COMPARISON:  None. FINDINGS: The ventricles are normal in size, for this patient's age, and normal in configuration. There are no parenchymal masses or mass effect. There is no evidence of a cortical infarct. There are no extra-axial masses or abnormal fluid collections. There is no intracranial hemorrhage. The visualized sinuses and mastoid air cells are clear. IMPRESSION: Normal unenhanced CT scan of the brain for age. Electronically  Signed   By: Lajean Manes M.D.   On: 04/08/2015 20:52   Mr Brain Wo Contrast  04/09/2015  CLINICAL DATA:  Trouble speaking, now resolved. Evaluate for infarct. EXAM: MRI HEAD WITHOUT CONTRAST MRA HEAD WITHOUT CONTRAST TECHNIQUE: Multiplanar, multiecho pulse sequences of the brain and surrounding structures were obtained without intravenous contrast. Angiographic images of the head were obtained using MRA technique without contrast. COMPARISON:  Head CT from yesterday FINDINGS: MRI HEAD FINDINGS Calvarium and upper cervical spine: No focal marrow signal abnormality. Orbits: Negative. Sinuses and Mastoids: Clear. Brain: No acute abnormality such as acute infarct, hemorrhage, hydrocephalus, or mass lesion. No evidence of large vessel occlusion. Generalized cerebral volume loss, expected for age. Mild for age small-vessel ischemic change in the deep cerebral white matter. 1 or 2 remote micro hemorrhages, incidental given few number. MRA HEAD FINDINGS Symmetric carotid arteries. Atherosclerotic irregularity of the carotid siphons without flow limiting stenosis. Mild irregularity of the distal left cervical ICA is likely atherosclerotic. Symmetric, smooth, and widely patent A1 and M1 segments. Some signal drop or motion at the level of the M1 -M2 junction. There could be superimposed atherosclerotic irregularity but no indication of flow limiting stenosis. Early branching right MCA. No evidence of major branch occlusion. Negative for aneurysm. Symmetric visualized vertebral arteries. The PICA origins were not seen due to coverage and early takeoff. The right PICA appears dominant. Dominant left AICA. Smooth and widely patent vertebral, basilar, and proximal posterior cerebral arteries. There is moderate atherosclerotic irregularity and narrowing of the left P3/4 segments. Negative for aneurysm. IMPRESSION: 1. Negative for acute infarct.  Unremarkable brain MRI for age. 2. Intracranial atherosclerosis without acute  finding. Electronically Signed   By: Monte Fantasia M.D.   On: 04/09/2015 12:36   US Carotid Bilateral  04/09/2015  CLINICAL DATA:  TIA. EXAM: BILATERAL CAROTID DUPLEX ULTRASOUND TECHNIQUE: Pearline Cables scale imaging, color Doppler and duplex ultrasound were performed of bilateral carotid and vertebral arteries in the neck. COMPARISON:  None. FINDINGS: Criteria: Quantification of carotid stenosis is based on velocity parameters that correlate the residual internal carotid diameter with NASCET-based stenosis levels, using the diameter of the distal internal carotid lumen as the denominator for stenosis measurement. The following velocity  measurements were obtained: RIGHT ICA:  95/17 cm/sec CCA:  [id number] cm/sec SYSTOLIC ICA/CCA RATIO:  1.5 DIASTOLIC ICA/CCA RATIO:  3.0 ECA:  142 cm/sec LEFT ICA:  92/18 cm/sec CCA:  0000000 cm/sec SYSTOLIC ICA/CCA RATIO:  1.2 DIASTOLIC ICA/CCA RATIO:  2.2 ECA:  122 cm/sec RIGHT CAROTID ARTERY: Moderate right common carotid and carotid bifurcation atherosclerotic vascular disease. RIGHT VERTEBRAL ARTERY:  Patent with antegrade flow. LEFT CAROTID ARTERY: Moderate left common carotid carotid bifurcation atherosclerotic vascular disease LEFT VERTEBRAL ARTERY:  Patent with antegrade flow. IMPRESSION: 1. Moderate bilateral common carotid and carotid bifurcation atherosclerotic vascular disease. Degree of stenosis less than 50% bilaterally. 2.  Vertebral arteries are patent antegrade flow. Electronically Signed   By: Marcello Moores  Register   On: 04/09/2015 14:05   Mr Jodene Nam Head/brain Wo Cm  04/09/2015  CLINICAL DATA:  Trouble speaking, now resolved. Evaluate for infarct. EXAM: MRI HEAD WITHOUT CONTRAST MRA HEAD WITHOUT CONTRAST TECHNIQUE: Multiplanar, multiecho pulse sequences of the brain and surrounding structures were obtained without intravenous contrast. Angiographic images of the head were obtained using MRA technique without contrast. COMPARISON:  Head CT from yesterday FINDINGS: MRI HEAD FINDINGS  Calvarium and upper cervical spine: No focal marrow signal abnormality. Orbits: Negative. Sinuses and Mastoids: Clear. Brain: No acute abnormality such as acute infarct, hemorrhage, hydrocephalus, or mass lesion. No evidence of large vessel occlusion. Generalized cerebral volume loss, expected for age. Mild for age small-vessel ischemic change in the deep cerebral white matter. 1 or 2 remote micro hemorrhages, incidental given few number. MRA HEAD FINDINGS Symmetric carotid arteries. Atherosclerotic irregularity of the carotid siphons without flow limiting stenosis. Mild irregularity of the distal left cervical ICA is likely atherosclerotic. Symmetric, smooth, and widely patent A1 and M1 segments. Some signal drop or motion at the level of the M1 -M2 junction. There could be superimposed atherosclerotic irregularity but no indication of flow limiting stenosis. Early branching right MCA. No evidence of major branch occlusion. Negative for aneurysm. Symmetric visualized vertebral arteries. The PICA origins were not seen due to coverage and early takeoff. The right PICA appears dominant. Dominant left AICA. Smooth and widely patent vertebral, basilar, and proximal posterior cerebral arteries. There is moderate atherosclerotic irregularity and narrowing of the left P3/4 segments. Negative for aneurysm. IMPRESSION: 1. Negative for acute infarct.  Unremarkable brain MRI for age. 2. Intracranial atherosclerosis without acute finding. Electronically Signed   By: Monte Fantasia M.D.   On: 04/09/2015 12:36     Management plans discussed with the patient, family and they are in agreement.  CODE STATUS:     Code Status Orders        Start     Ordered   04/09/15 0138  Full code   Continuous     04/09/15 0137    Code Status History    Date Active Date Inactive Code Status Order ID Comments User Context   This patient has a current code status but no historical code status.    Advance Directive Documentation         Most Recent Value   Type of Advance Directive  Healthcare Power of Attorney, Living will   Pre-existing out of facility DNR order (yellow form or pink MOST form)     "MOST" Form in Place?        TOTAL TIME TAKING CARE OF THIS PATIENT: 28 minutes.    Yaffa Seckman,  Karenann Cai.D on 04/09/2015 at 2:39 PM  Between 7am to 6pm - Pager - [phone]  After  6pm go to [email or web address] - password [email or web address]  Office  [phone]  CC: Primary care physician; Kathrine Haddock, NP

## 2015-04-09 NOTE — Evaluation (Signed)
Occupational Therapy Evaluation Patient Details Name: Jimmy Friedman MRN: [id number] DOB: 05/14/1927 Today's Date: 04/09/2015    History of Present Illness HISTORY OF PRESENT ILLNESS: Jimmy Friedman is a 82 y.o. male with a known history of coronary artery disease, myocardial infarction, prostate cancer, hypertension, hyperlipidemia came to visit the hospital for his wife has been admitted. He felt he couldn't speak suddenly and he was registered in the emergency room for evaluation of stroke. Patient also complains of some tingling sensation in the right hand. No history of any numbness or any weakness in any part of the body. Patient was worked up with a CT head which showed no acute intracranial abnormality. Telemetry neurology consult was done who recommended MRI brain start patient on aspirin. Patient's speech is much better later on and he did not have any trouble articulate words . No history of chest pain or shortness of breath. Patient is completely alert awake oriented time place and person and moves all extremities. Patient says they have 3 panel natural heater at home, and probably his confusion and present symptoms could be secondary to carbon monoxide elevation at homeHISTORY OF PRESENT ILLNESS: Jimmy Friedman is a 82 y.o. male with a known history of coronary artery disease, myocardial infarction, prostate cancer, hypertension, hyperlipidemia came to visit the hospital for his wife has been admitted. He felt he couldn't speak suddenly and he was registered in the emergency room for evaluation of stroke. Patient also complains of some tingling sensation in the right hand. No history of any numbness or any weakness in any part of the body. Patient was worked up with a CT head which showed no acute intracranial abnormality. Telemetry neurology consult was done who recommended MRI brain start patient on aspirin. Patient's speech is much better later on and he did not have any trouble  articulate words . No history of chest pain or shortness of breath. Patient is completely alert awake oriented time place and person and moves all extremities. Patient says they have 3 panel natural heater at home, and probably his confusion and present symptoms could be secondary to carbon monoxide elevation at home   Clinical Impression   Pt. Is an 82 y.o. Male who was [facility] to [facility] with slurred speech, and tingling in the right hand. Pt. currently resides at home with his wife who is currently in the hospital. Pt. Was working ,driving, and flying. Pt. performed functional mobility for standing ADL tasks with supervision without any episodes of LOB. Pt. Right UE and hand is at baseline. Pt. Is able to engage his RUE and hand in ADL tasks without difficulty. OT skilled services are discharged at this time. No further OT needs are indicated at this time at this level of care    Follow Up Recommendations    No further OT follow up needs are indicated at this time.   Equipment Recommendations       Recommendations for Other Services PT consult     Precautions / Restrictions Precautions Precautions: None      Mobility Bed Mobility Overal bed mobility: Independent                Transfers Overall transfer level:  (Supervision for sit to stand and walking to the counter. )               General transfer comment:  (Pt. education was thoroughly provided about the use of gait belts for safety when assessing mobility.)    Balance  ADL Overall ADL's : Needs assistance/impaired Eating/Feeding: Independent   Grooming: Independent                                 General ADL Comments: Pt. requires supervision for standing ADL tasks at the counter. Pt. was independent with LE ADL tasks.     Vision     Perception     Praxis      Pertinent Vitals/Pain Pain Assessment: No/denies pain      Hand Dominance Right   Extremity/Trunk Assessment Upper Extremity Assessment Upper Extremity Assessment: Overall WFL for tasks assessed (Intact sensation for light touch and proprioceptive awareness.)           Communication Communication Communication: No difficulties   Cognition Arousal/Alertness: Awake/alert Behavior During Therapy: WFL for tasks assessed/performed Overall Cognitive Status: Within Functional Limits for tasks assessed                     General Comments       Exercises       Shoulder Instructions      Home Living Family/patient expects to be discharged to:: Private residence Living Arrangements: Spouse/significant other Available Help at Discharge: Family Type of Home: House Home Access: Stairs to enter Technical brewer of Steps: 4   Home Layout: One level     Bathroom Shower/Tub: Walk-in Hydrologist: Standard                Prior Functioning/Environment Level of Independence: Independent        Comments: Pt. was Independent with ADls/IADLs , working fulltime, driving ,and flying.    OT Diagnosis: Generalized weakness   OT Problem List:     OT Treatment/Interventions:      OT Goals(Current goals can be found in the care plan section)    OT Frequency:     Barriers to D/C:            Co-evaluation              End of Session Equipment Utilized During Treatment: Gait belt (Pt. ed. about gait belt use when assessing mobility, as pt. insists that he doesn't need the gait belt.Marland Kitchen)  Activity Tolerance: Patient tolerated treatment well Patient left:     Time:  -    Charges:  OT General Charges $OT Visit: 1 Procedure OT Evaluation $OT Eval Low Complexity: 1 Procedure G-Codes:    Harrel Carina, MS, OTR/L  Harrel Carina 04/09/2015, 12:38 PM

## 2015-04-09 NOTE — ED Notes (Signed)
Pt in bed with eyes closed. No distress noted. No needs identified at this time. Pt in no acute distress.

## 2015-04-09 NOTE — Progress Notes (Signed)
Discharge instructions given. IV and tele removed. Stroke prevention education given. No new medications. Patient will make appointment with PCP. Daughter is here now and patient is discharging.

## 2015-04-10 ENCOUNTER — Emergency Department: Payer: Medicare Other

## 2015-04-10 ENCOUNTER — Encounter: Payer: Self-pay | Admitting: Emergency Medicine

## 2015-04-10 ENCOUNTER — Emergency Department
Admission: EM | Admit: 2015-04-10 | Discharge: 2015-04-10 | Disposition: A | Discharge status: Other Acute Inpatient Hospital | Payer: Medicare Other | Attending: Emergency Medicine | Admitting: Emergency Medicine

## 2015-04-10 DIAGNOSIS — I252 Old myocardial infarction: Secondary | ICD-10-CM | POA: Insufficient documentation

## 2015-04-10 DIAGNOSIS — Z87891 Personal history of nicotine dependence: Secondary | ICD-10-CM | POA: Diagnosis not present

## 2015-04-10 DIAGNOSIS — Z7982 Long term (current) use of aspirin: Secondary | ICD-10-CM | POA: Insufficient documentation

## 2015-04-10 DIAGNOSIS — Z880 Allergy status to penicillin: Secondary | ICD-10-CM | POA: Insufficient documentation

## 2015-04-10 DIAGNOSIS — I6350 Cerebral infarction due to unspecified occlusion or stenosis of unspecified cerebral artery: Secondary | ICD-10-CM | POA: Diagnosis not present

## 2015-04-10 DIAGNOSIS — Z951 Presence of aortocoronary bypass graft: Secondary | ICD-10-CM | POA: Diagnosis not present

## 2015-04-10 DIAGNOSIS — Z7952 Long term (current) use of systemic steroids: Secondary | ICD-10-CM | POA: Diagnosis not present

## 2015-04-10 DIAGNOSIS — Z79899 Other long term (current) drug therapy: Secondary | ICD-10-CM | POA: Diagnosis not present

## 2015-04-10 DIAGNOSIS — I10 Essential (primary) hypertension: Secondary | ICD-10-CM | POA: Diagnosis not present

## 2015-04-10 DIAGNOSIS — I2510 Atherosclerotic heart disease of native coronary artery without angina pectoris: Secondary | ICD-10-CM | POA: Insufficient documentation

## 2015-04-10 DIAGNOSIS — R4789 Other speech disturbances: Secondary | ICD-10-CM | POA: Diagnosis present

## 2015-04-10 HISTORY — DX: Transient cerebral ischemic attack, unspecified: G45.9

## 2015-04-10 LAB — DIFFERENTIAL
BASOS ABS: 0 10*3/uL (ref 0–0.1)
Basophils Relative: 0 %
EOS ABS: 0.1 10*3/uL (ref 0–0.7)
Eosinophils Relative: 1 %
LYMPHS ABS: 0.7 10*3/uL — AB (ref 1.0–3.6)
Lymphocytes Relative: 8 %
Monocytes Absolute: 0.9 10*3/uL (ref 0.2–1.0)
Monocytes Relative: 11 %
NEUTROS PCT: 80 %
Neutro Abs: 7 10*3/uL — ABNORMAL HIGH (ref 1.4–6.5)

## 2015-04-10 LAB — COMPREHENSIVE METABOLIC PANEL
ALBUMIN: 4.4 g/dL (ref 3.5–5.0)
ALT: 22 U/L (ref 17–63)
AST: 39 U/L (ref 15–41)
Alkaline Phosphatase: 40 U/L (ref 38–126)
Anion gap: 10 (ref 5–15)
BILIRUBIN TOTAL: 1.8 mg/dL — AB (ref 0.3–1.2)
BUN: 34 mg/dL — AB (ref 6–20)
CHLORIDE: 96 mmol/L — AB (ref 101–111)
CO2: 30 mmol/L (ref 22–32)
Calcium: 9.9 mg/dL (ref 8.9–10.3)
Creatinine, Ser: 1.46 mg/dL — ABNORMAL HIGH (ref 0.61–1.24)
GFR calc Af Amer: 48 mL/min — ABNORMAL LOW (ref >60)
GFR calc non Af Amer: 42 mL/min — ABNORMAL LOW (ref >60)
GLUCOSE: 117 mg/dL — AB (ref 65–99)
POTASSIUM: 3.5 mmol/L (ref 3.5–5.1)
Sodium: 136 mmol/L (ref 135–145)
TOTAL PROTEIN: 7.7 g/dL (ref 6.5–8.1)

## 2015-04-10 LAB — APTT: APTT: 29 s (ref 24–36)

## 2015-04-10 LAB — CBC
HCT: 44.1 % (ref 40.0–52.0)
HEMOGLOBIN: 14.8 g/dL (ref 13.0–18.0)
MCH: 32.1 pg (ref 26.0–34.0)
MCHC: 33.5 g/dL (ref 32.0–36.0)
MCV: 95.7 fL (ref 80.0–100.0)
Platelets: 108 10*3/uL — ABNORMAL LOW (ref 150–440)
RBC: 4.61 MIL/uL (ref 4.40–5.90)
RDW: 13.3 % (ref 11.5–14.5)
WBC: 8.8 10*3/uL (ref 3.8–10.6)

## 2015-04-10 LAB — ETHANOL

## 2015-04-10 LAB — TROPONIN I: Troponin I: <0.03 ng/mL (ref <0.031)

## 2015-04-10 LAB — PROTIME-INR
INR: 1.06
PROTHROMBIN TIME: 14 s (ref 11.4–15.0)

## 2015-04-10 MED ORDER — SODIUM CHLORIDE 0.9 % IV SOLN: 50.0000 mL | Freq: Once | INTRAVENOUS | Status: DC

## 2015-04-10 MED ORDER — ALTEPLASE 100 MG IV SOLR
INTRAVENOUS | Status: AC
  Filled 2015-04-10: qty 1

## 2015-04-10 MED ORDER — ALTEPLASE (STROKE) FULL DOSE INFUSION
0.9000 mg/kg | Freq: Once | INTRAVENOUS | Status: AC
  Administered 2015-04-10: 63 mg via INTRAVENOUS
  Filled 2015-04-10: qty 100

## 2015-04-10 MED ORDER — LABETALOL HCL 5 MG/ML IV SOLN
10.0000 mg | Freq: Once | INTRAVENOUS | Status: AC
  Administered 2015-04-10: 10 mg via INTRAVENOUS
  Filled 2015-04-10: qty 4

## 2015-04-10 NOTE — ED Provider Notes (Signed)
Time Seen: Approximately 1204  I have reviewed the triage notes  Chief Complaint: Transient Ischemic Attack   History of Present Illness: Jimmy Friedman is a 82 y.o. Jimmy Friedman presents with acute onset of difficulty with speech. Patient was just recently discharged from the hospital after workup for transient ischemic attack. He has a known history of an aortic valve replacement, coronary artery disease, and myocardial infarction. The patient was discharged and appeared to back to baseline. Review of records shows a negative MRA, MRI. Patient apparently had some trouble with speech tonight after he was discharged which seemed to resolve. He was brought to the hospital today to visit with his wife again and apparently developed acute onset of difficulty finding words. Obvious focal weakness. Denies any headaches. Denies any chest or abdominal pain. No obvious visual acuity issues. Symptoms started approximately 1 hour prior to my evaluation.   Past Medical History  Diagnosis Date  . Coronary artery disease   . MI (myocardial infarction) ([place])   . S/P AVR (aortic valve replacement)   . Prostate cancer ([place])   . Kidney problem   . Hypertension   . Hyperlipidemia   . Chronic prostatitis   . TIA (transient ischemic attack)     Patient Active Problem List   Diagnosis Date Noted  . TIA (transient ischemic attack) 04/08/2015  . Chronic cough 03/02/2015  . Prostate cancer ([place]) 08/19/2014  . Chronic prostatitis 08/18/2014  . Coronary artery disease involving coronary bypass graft of native heart without angina pectoris 08/04/2014  . S/P CABG x 3 08/04/2014  . Hyperlipidemia 08/04/2014  . Essential hypertension 08/04/2014  . S/P aortic valve replacement with bioprosthetic valve 08/04/2014  . Thoracic aorta atherosclerosis ([place]) 08/04/2014    Past Surgical History  Procedure Laterality Date  . Coronary artery bypass graft    . Aortic valve replacement    . Kidney surgery       Past Surgical History  Procedure Laterality Date  . Coronary artery bypass graft    . Aortic valve replacement    . Kidney surgery      Current Outpatient Rx  Name  Route  Sig  Dispense  Refill  . aspirin 81 MG tablet   Oral   Take 81 mg by mouth daily.         Marland Kitchen CALCIUM-MAGNESIUM-ZINC PO   Oral   Take by mouth daily.         . carvedilol (COREG) 6.25 MG tablet   Oral   Take 1 tablet (6.25 mg total) by mouth 2 (two) times daily.   180 tablet   3   . Cholecalciferol (VITAMIN D3) 2000 UNITS TABS   Oral   Take 2,000 mg by mouth daily.         . clotrimazole-betamethasone (LOTRISONE) cream   Topical   Apply 1 application topically 2 (two) times daily.   30 g   0   . fluticasone (FLOVENT HFA) 220 MCG/ACT inhaler   Inhalation   Inhale into the lungs.         . furosemide (LASIX) 20 MG tablet   Oral   Take 1 tablet (20 mg total) by mouth daily.   5 tablet   0   . Glucosamine-Chondroitin 500-400 MG CAPS   Oral   Take by mouth 2 (two) times daily.         Marland Kitchen losartan (COZAAR) 25 MG tablet   Oral   Take 1 tablet (25 mg total) by  mouth daily.   90 tablet   3   . Multiple Vitamin (MULTIVITAMIN) tablet   Oral   Take 1 tablet by mouth daily.         . pravastatin (PRAVACHOL) 40 MG tablet      TAKE 1 TABLET EACH DAY   30 tablet   1   . vitamin C (ASCORBIC ACID) 500 MG tablet   Oral   Take 500 mg by mouth daily.         Marland Kitchen VITAMIN E PO   Oral   Take by mouth daily.           Allergies:  Penicillins and Sulfamethoxazole-trimethoprim  Family History: Family History  Problem Relation Age of Onset  . Heart Problems Mother   . Osteoporosis Mother   . Heart Problems Father   . Hypertension Father   . Heart Problems Son     Social History: Social History  Substance Use Topics  . Smoking status: Former Research scientist (life sciences)  . Smokeless tobacco: Never Used  . Alcohol Use: 0.0 oz/week    0 Standard drinks or equivalent per week     Comment:  beer once a month     Review of Systems:   10 point review of systems was performed and was otherwise negative:  Constitutional: No fever Eyes: No visual disturbances ENT: No sore throat, ear pain Cardiac: No chest pain Respiratory: No shortness of breath, wheezing, or stridor Abdomen: No abdominal pain, no vomiting, No diarrhea Endocrine: No weight loss, No night sweats Extremities: No peripheral edema, cyanosis Skin: No rashes, easy bruising Neurologic: No focal weakness no trouble swallowing. No facial weakness Urologic: No dysuria, Hematuria, or urinary frequency   Physical Exam:  ED Triage Vitals  Enc Vitals Group     BP --      Pulse Rate 04/10/15 1205 71     Resp 04/10/15 1205 21     Temp --      Temp src --      SpO2 04/10/15 1205 97 %     Weight 04/10/15 1205 154 lb (69.854 kg)     Height 04/10/15 1205 6\' 1"  (1.854 m)     Head Cir --      Peak Flow --      Pain Score 04/10/15 1202 0     Pain Loc --      Pain Edu? --      Excl. in [place]? --     General: Awake , Alert , and Oriented times 3; GCS 15 Head: Normal cephalic , atraumatic Eyes: Pupils equal , round, reactive to light Nose/Throat: No nasal drainage, patent upper airway without erythema or exudate.  Neck: Supple, Full range of motion, No anterior adenopathy or palpable thyroid masses Lungs: Clear to ascultation without wheezes , rhonchi, or rales Heart: Regular rate, regular rhythm without murmurs , gallops , or rubs Abdomen: Soft, non tender without rebound, guarding , or rigidity; bowel sounds positive and symmetric in all 4 quadrants. No organomegaly .        Extremities: 2 plus symmetric pulses. No edema, clubbing or cyanosis Neurologic: normal ambulation, Motor symmetric without deficits, sensory intact Skin: warm, dry, no rashes   Labs:   All laboratory work was reviewed including any pertinent negatives or positives listed below:  Labs Reviewed  CBC - Abnormal; Notable for the following:     Platelets 108 (*)    All other components within normal limits  DIFFERENTIAL - Abnormal; Notable  for the following:    Neutro Abs 7.0 (*)    Lymphs Abs 0.7 (*)    All other components within normal limits  ETHANOL  PROTIME-INR  APTT  COMPREHENSIVE METABOLIC PANEL  TROPONIN I    EKG: * ED ECG REPORT I, Daymon Larsen, the attending physician, personally viewed and interpreted this ECG.  Date: 04/10/2015 EKG Time: 1201 Rate: 68 Rhythm: normal sinus rhythm QRS Axis: normal Intervals: Right bundle-branch block, left anterior fascicular block ST/T Wave abnormalities: normal Conduction Disturbances: none Narrative Interpretation: unremarkable No significant change from EKG performed on 04/04/2015  Radiology:  EXAM: CT HEAD WITHOUT CONTRAST  TECHNIQUE: Contiguous axial images were obtained from the base of the skull through the vertex without intravenous contrast.  COMPARISON: CT scan of April 08, 2015.  FINDINGS: Bony calvarium appears intact. Minimal diffuse cortical atrophy is noted. Mild chronic ischemic white matter disease is noted. No mass effect or midline shift is noted. Ventricular size is within normal limits. There is no evidence of mass lesion, hemorrhage or acute infarction.  IMPRESSION: Minimal diffuse cortical atrophy. Mild chronic ischemic white matter disease. No acute intracranial abnormality seen. These results were called by telephone at the time of interpretation on 04/10/2015 at 12:45 pm to Dr. Meade Maw , who verbally acknowledged these results.   Electronically Signed By: Marijo Conception, M.D. On: 04/10/2015 12:46    I personally reviewed the radiologic studies  CRITICAL CARE Performed by: Daymon Larsen   Total critical care time: 37 minutes  Critical care time was exclusive of separately billable procedures and treating other patients.  Critical care was necessary to treat or prevent imminent or life-threatening  deterioration.  Critical care was time spent personally by me on the following activities: development of treatment plan with patient and/or surrogate as well as nursing, discussions with consultants, evaluation of patient's response to treatment, examination of patient, obtaining history from patient or surrogate, ordering and performing treatments and interventions, ordering and review of laboratory studies, ordering and review of radiographic studies, pulse oximetry and re-evaluation of patient's condition. Management of acute cerebrovascular accident  ED course Due to the patient's acute a fascia that code stroke was called. Patient received a head CT which showed no hemorrhagic stroke at this time. The patient still had persistent a fascia was within the three-hour window for TPA. Specialist on call neurology assessed the patient at the bedside and felt that it the patient is a TPA candidate. TPA was initiated here in emergency department per protocol. The patient's a fascia has improved at this point. He denies any headache and at this time appears to be hemodynamically stable. The patient has had transfer arrangements made with [facility]. Patient needs to be transferred secondary to lack of neurosurgery backup. The patient  and  family were given the options and they selected [facility].     Assessment:  Acute cerebrovascular accident    Plan:  Transferred to [place], MD 04/10/15 [phone]

## 2015-04-10 NOTE — ED Notes (Signed)
Pt transferred to [facility]. NAD at this time.

## 2015-04-10 NOTE — ED Notes (Signed)
Pt was discharged from ED on Thursday after a TIA. Per daughter pt has been having off/on slurred speech and difficulty putting thoughts and words together since yesterday.

## 2015-04-10 NOTE — ED Notes (Signed)
MD Quigley at bedside. 

## 2015-04-19 ENCOUNTER — Encounter: Payer: Self-pay | Admitting: Family Medicine

## 2015-04-19 ENCOUNTER — Ambulatory Visit (INDEPENDENT_AMBULATORY_CARE_PROVIDER_SITE_OTHER): Payer: Medicare Other | Admitting: Family Medicine

## 2015-04-19 VITALS — BP 101/64 | HR 68 | Temp 97.8°F | Ht 70.7 in | Wt 157.0 lb

## 2015-04-19 DIAGNOSIS — E785 Hyperlipidemia, unspecified: Secondary | ICD-10-CM

## 2015-04-19 DIAGNOSIS — G458 Other transient cerebral ischemic attacks and related syndromes: Secondary | ICD-10-CM | POA: Diagnosis not present

## 2015-04-19 DIAGNOSIS — I10 Essential (primary) hypertension: Secondary | ICD-10-CM

## 2015-04-19 DIAGNOSIS — I25810 Atherosclerosis of coronary artery bypass graft(s) without angina pectoris: Secondary | ICD-10-CM

## 2015-04-19 NOTE — Assessment & Plan Note (Signed)
No further residual symptoms except for maybe some left hand transient symptoms. Will observe patient knows about calling 911 taking aspirin every day

## 2015-04-19 NOTE — Progress Notes (Signed)
BP 101/64 mmHg  Pulse 68  Temp(Src) 97.8 F (36.6 C)  Ht 5' 10.7" (1.796 m)  Wt 157 lb (71.215 kg)  BMI 22.08 kg/m2  SpO2 98%   Subjective:    Patient ID: Jimmy Friedman, male    DOB: [birth year], 82 y.o.   MRN: [id number]  HPI: Jimmy Friedman is a 82 y.o. male  Chief Complaint  Patient presents with  . Hospitalization Follow-up    stroke   Patient doing well status post TIA has had maybe some slight residual symptoms and left posterior hand but otherwise back to baseline. No change in medications just make sure he is taking aspirin every day which he is. Patient feeling well and back driving again. Reviewed medication and medical problems which are stable especially blood pressure and cholesterol Patient with no chest pain or cardiovascular symptoms no skipped heartbeat has an appointment with cardiology coming up tomorrow.  Relevant past medical, surgical, family and social history reviewed and updated as indicated. Interim medical history since our last visit reviewed. Allergies and medications reviewed and updated.  Review of Systems  Constitutional: Negative.   Respiratory: Negative.   Cardiovascular: Negative.     Per HPI unless specifically indicated above     Objective:    BP 101/64 mmHg  Pulse 68  Temp(Src) 97.8 F (36.6 C)  Ht 5' 10.7" (1.796 m)  Wt 157 lb (71.215 kg)  BMI 22.08 kg/m2  SpO2 98%  Wt Readings from Last 3 Encounters:  04/19/15 157 lb (71.215 kg)  04/10/15 154 lb (69.854 kg)  04/09/15 151 lb (68.493 kg)    Physical Exam  Constitutional: He is oriented to person, place, and time. He appears well-developed and well-nourished. No distress.  HENT:  Head: Normocephalic and atraumatic.  Right Ear: Hearing normal.  Left Ear: Hearing normal.  Nose: Nose normal.  Eyes: Conjunctivae and lids are normal. Right eye exhibits no discharge. Left eye exhibits no discharge. No scleral icterus.  Cardiovascular: Normal rate, regular rhythm and  normal heart sounds.   Pulmonary/Chest: Effort normal and breath sounds normal. No respiratory distress.  Musculoskeletal: Normal range of motion.  Neurological: He is alert and oriented to person, place, and time. He displays normal reflexes. No cranial nerve deficit. He exhibits normal muscle tone. Coordination normal.  Skin: Skin is intact. No rash noted.  Psychiatric: He has a normal mood and affect. His speech is normal and behavior is normal. Judgment and thought content normal. Cognition and memory are normal.    Results for orders placed or performed during the hospital encounter of 04/10/15  Ethanol  Result Value Ref Range   Alcohol, Ethyl (B) <5 <5 mg/dL  Protime-INR  Result Value Ref Range   Prothrombin Time 14.0 11.4 - 15.0 seconds   INR 1.06   APTT  Result Value Ref Range   aPTT 29 24 - 36 seconds  CBC  Result Value Ref Range   WBC 8.8 3.8 - 10.6 K/uL   RBC 4.61 4.40 - 5.90 MIL/uL   Hemoglobin 14.8 13.0 - 18.0 g/dL   HCT 44.1 40.0 - 52.0 %   MCV 95.7 80.0 - 100.0 fL   MCH 32.1 26.0 - 34.0 pg   MCHC 33.5 32.0 - 36.0 g/dL   RDW 13.3 11.5 - 14.5 %   Platelets 108 (L) 150 - 440 K/uL  Differential  Result Value Ref Range   Neutrophils Relative % 80 %   Neutro Abs 7.0 (H) 1.4 - 6.5 K/uL  Lymphocytes Relative 8 %   Lymphs Abs 0.7 (L) 1.0 - 3.6 K/uL   Monocytes Relative 11 %   Monocytes Absolute 0.9 0.2 - 1.0 K/uL   Eosinophils Relative 1 %   Eosinophils Absolute 0.1 0 - 0.7 K/uL   Basophils Relative 0 %   Basophils Absolute 0.0 0 - 0.1 K/uL  Comprehensive metabolic panel  Result Value Ref Range   Sodium 136 135 - 145 mmol/L   Potassium 3.5 3.5 - 5.1 mmol/L   Chloride 96 (L) 101 - 111 mmol/L   CO2 30 22 - 32 mmol/L   Glucose, Bld 117 (H) 65 - 99 mg/dL   BUN 34 (H) 6 - 20 mg/dL   Creatinine, Ser 1.46 (H) 0.61 - 1.24 mg/dL   Calcium 9.9 8.9 - 10.3 mg/dL   Total Protein 7.7 6.5 - 8.1 g/dL   Albumin 4.4 3.5 - 5.0 g/dL   AST 39 15 - 41 U/L   ALT 22 17 - 63 U/L    Alkaline Phosphatase 40 38 - 126 U/L   Total Bilirubin 1.8 (H) 0.3 - 1.2 mg/dL   GFR calc non Af Amer 42 (L) >60 mL/min   GFR calc Af Amer 48 (L) >60 mL/min   Anion gap 10 5 - 15  Troponin I  Result Value Ref Range   Troponin I <0.03 <0.031 ng/mL      Assessment & Plan:   Problem List Items Addressed This Visit      Cardiovascular and Mediastinum   Essential hypertension    The current medical regimen is effective;  continue present plan and medications.       TIA (transient ischemic attack) - Primary    No further residual symptoms except for maybe some left hand transient symptoms. Will observe patient knows about calling 911 taking aspirin every day        Other   Hyperlipidemia    The current medical regimen is effective;  continue present plan and medications.           Follow up plan: Return for As scheduled in August.

## 2015-04-19 NOTE — Assessment & Plan Note (Signed)
The current medical regimen is effective;  continue present plan and medications.  

## 2015-04-20 ENCOUNTER — Encounter: Payer: Self-pay | Admitting: Cardiovascular Disease

## 2015-04-20 ENCOUNTER — Ambulatory Visit (INDEPENDENT_AMBULATORY_CARE_PROVIDER_SITE_OTHER): Payer: Medicare Other | Admitting: Cardiovascular Disease

## 2015-04-20 VITALS — BP 118/62 | HR 73 | Ht 70.0 in | Wt 156.5 lb

## 2015-04-20 DIAGNOSIS — Z953 Presence of xenogenic heart valve: Secondary | ICD-10-CM

## 2015-04-20 DIAGNOSIS — I10 Essential (primary) hypertension: Secondary | ICD-10-CM | POA: Diagnosis not present

## 2015-04-20 DIAGNOSIS — Z954 Presence of other heart-valve replacement: Secondary | ICD-10-CM

## 2015-04-20 DIAGNOSIS — G458 Other transient cerebral ischemic attacks and related syndromes: Secondary | ICD-10-CM

## 2015-04-20 DIAGNOSIS — Z951 Presence of aortocoronary bypass graft: Secondary | ICD-10-CM | POA: Diagnosis not present

## 2015-04-20 DIAGNOSIS — I25810 Atherosclerosis of coronary artery bypass graft(s) without angina pectoris: Secondary | ICD-10-CM | POA: Diagnosis not present

## 2015-04-20 MED ORDER — FUROSEMIDE 20 MG PO TABS: 20.0000 mg | ORAL_TABLET | Freq: Every day | ORAL | Status: DC | PRN

## 2015-04-20 NOTE — Assessment & Plan Note (Signed)
Last echocardiogram 2 years ago, He did not follow-up for echocardiogram in 2016 Recommended echo in follow-up

## 2015-04-20 NOTE — Assessment & Plan Note (Signed)
Long discussion concerning the recent events, Hospital records reviewed.  Etiology of his TIA/stroke is unclear. Unable to exclude underlying arrhythmia. 30 day monitor discussed with him, this has been ordered.  He does have moderate bilateral carotid disease, unable to exclude embolic phenomenon. We did discuss other forms of antiplatelet therapy such as adding Plavix to his aspirin. He prefers to stay on aspirin by itself for now. Recommended he call us immediately for any further TIA symptoms   Total encounter time more than 25 minutes  Greater than 50% was spent in counseling and coordination of care with the patient

## 2015-04-20 NOTE — Assessment & Plan Note (Signed)
Blood pressure is well controlled on today's visit. No changes made to the medications. 

## 2015-04-20 NOTE — Progress Notes (Signed)
Patient ID: Jimmy Friedman, male    DOB: 1927/06/21, 82 y.o.   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 Last stress test was March 2016  In follow-up, Jimmy Friedman reports that 04/09/2015 Jimmy Friedman had a TIA, associated confusion, speech problems. Jimmy Friedman went to the emergency room,Symptoms resolved without intervention and Jimmy Friedman was discharged home.  The next day 04/10/2015 Jimmy Friedman had recurrent symptoms, was given TPA in the emergency room, life flighted to [facility], was kept inpatient for 2 days Discharged on low-dose aspirin which she was taking previously Carotid ultrasound done at [facility] showed moderate bilateral carotid arterial disease  Lab work reviewed showing total cholesterol 121, LDL 61. Jimmy Friedman has been compliant with his statin Jimmy Friedman denies any tachycardia or palpitations concerning for arrhythmia such as atrial fibrillation. Otherwise has been active. Reports that his wife has deteriorating health.  EKG on today's visit shows normal sinus rhythm with rate 73 bpm, right bundle branch block APCs in a bigeminal pattern, left anterior fascicular block  Lab Results      Component                Value               Date                      CHOL                     133                 08/19/2014                HDL                      48                  08/19/2014                LDLCALC                  71                  08/19/2014                TRIG                     70                  08/19/2014         Other past medical history reviewed CT scan March  2016 showing moderate thoracic aortic atherosclerosis without aneurysm reticular and groundglass opacities at the lung bases, maybe atelectasis versus pneumonitis, also with gallstones, diverticulosis There was no ascending aorta aneurysm  No significant prior smoking history. Jimmy Friedman does report chewing on cigarettes/cigars.  Prior stress test  03/24/2014. Jimmy Friedman exercised for 9 minutes, Bruce protocol, achieved 10 METS, hypertensive response with exercise, achieved target heart rate, no EKG changes concerning for ischemia  Echocardiogram 03/20/2014 with ejection fraction 60%, mild LVH, mild to moderately  elevated right heart pressures 46-50 mmHg   Cardiac catheterization September 2011 with 90% proximal left circumflex disease, 90% mid LAD disease, 60% mid to distal RCA disease       Allergies  Allergen Reactions  . Penicillins     Other reaction(s): Unknown  . Sulfamethoxazole-Trimethoprim     Other reaction(s): Unknown    Current Outpatient Prescriptions on File Prior to Visit  Medication Sig Dispense Refill  . aspirin 81 MG tablet Take 81 mg by mouth daily.    Marland Kitchen CALCIUM-MAGNESIUM-ZINC PO Take by mouth daily.    . carvedilol (COREG) 6.25 MG tablet Take 1 tablet (6.25 mg total) by mouth 2 (two) times daily. 180 tablet 3  . Cholecalciferol (VITAMIN D3) 2000 UNITS TABS Take 2,000 mg by mouth daily.    . clotrimazole-betamethasone (LOTRISONE) cream Apply 1 application topically 2 (two) times daily. 30 g 0  . fluticasone (FLOVENT HFA) 220 MCG/ACT inhaler Inhale into the lungs.    . Glucosamine-Chondroitin 500-400 MG CAPS Take by mouth 2 (two) times daily.    Marland Kitchen losartan (COZAAR) 25 MG tablet Take 1 tablet (25 mg total) by mouth daily. 90 tablet 3  . Multiple Vitamin (MULTIVITAMIN) tablet Take 1 tablet by mouth daily.    . pravastatin (PRAVACHOL) 40 MG tablet TAKE 1 TABLET EACH DAY 30 tablet 1  . vitamin C (ASCORBIC ACID) 500 MG tablet Take 500 mg by mouth daily.    Marland Kitchen VITAMIN E PO Take by  mouth daily.     No current facility-administered medications on file prior to visit.    Past Medical History  Diagnosis Date  . Coronary artery disease   . MI (myocardial infarction) ([place])   . S/P AVR (aortic valve replacement)   . Prostate cancer ([place])   . Kidney problem   . Hypertension   . Hyperlipidemia   . Chronic prostatitis   . TIA (transient ischemic attack)     Past Surgical History  Procedure Laterality Date  . Coronary artery bypass graft    . Aortic valve replacement    . Kidney surgery      Social History  reports that Jimmy Friedman has quit smoking. Jimmy Friedman has never used smokeless tobacco. Jimmy Friedman reports that Jimmy Friedman drinks alcohol. Jimmy Friedman reports that Jimmy Friedman does not use illicit drugs.  Family History family history includes Heart Problems in his father, mother, and son; Hypertension in his father; Osteoporosis in his mother.   Review of Systems  Constitutional: Negative.   Respiratory: Negative.   Cardiovascular: Negative.   Gastrointestinal: Negative.   Endocrine: Negative.   Musculoskeletal: Negative.   Neurological: Negative.   Hematological: Negative.   Psychiatric/Behavioral: Negative.   All other systems reviewed and are negative.   BP 118/62 mmHg  Pulse 73  Ht 5\' 10"  (1.778 m)  Wt 156 lb 8 oz (70.988 kg)  BMI 22.46 kg/m2  Physical Exam  Constitutional: Jimmy Friedman is oriented to person, place, and time. Jimmy Friedman appears well-developed and well-nourished.  HENT:  Head: Normocephalic.  Nose: Nose normal.  Mouth/Throat: Oropharynx is clear and moist.  Eyes: Conjunctivae are normal. Pupils are equal, round, and reactive to light.  Neck: Normal range of motion. Neck supple. No JVD present.  Cardiovascular: Normal rate, regular rhythm, S1 normal, S2 normal and intact distal pulses.  Exam reveals no gallop and no friction rub.   Murmur heard.  Crescendo systolic murmur is present with a grade of 2/6  Pulmonary/Chest: Effort normal and breath sounds normal. No respiratory distress. Jimmy Friedman  has no wheezes. Jimmy Friedman has no rales. Jimmy Friedman exhibits no tenderness.  Abdominal: Soft. Bowel sounds are normal. Jimmy Friedman exhibits no distension. There is no tenderness.  Musculoskeletal: Normal range of motion. Jimmy Friedman exhibits no edema or tenderness.  Lymphadenopathy:    Jimmy Friedman has no cervical adenopathy.  Neurological: Jimmy Friedman is alert and oriented to person, place, and time. Coordination normal.  Skin: Skin is warm and dry. No rash noted. No erythema.  Psychiatric: Jimmy Friedman has a normal mood and affect. His behavior is normal. Judgment and thought content normal.      Assessment and Plan   Nursing note and vitals reviewed.

## 2015-04-20 NOTE — Patient Instructions (Addendum)
You are doing well. No medication changes were made.  We will order a 30 day monitor for recent TIA/stroke, hx of atrial fibrillation We will call you with the results If you have further TIA symptoms, please call the office [employer] will call you to verify your address before mailing monitor to your home  Please call us if you have new issues that need to be addressed before your next appt.  Your physician wants you to follow-up in: 6 months You will receive a reminder letter in the mail two months in advance. If you don't receive a letter, please call our office to schedule the follow-up appointment.   Cardiac Event Monitoring A cardiac event monitor is a small recording device used to help detect abnormal heart rhythms (arrhythmias). The monitor is used to record heart rhythm when noticeable symptoms such as the following occur:  Fast heartbeats (palpitations), such as heart racing or fluttering.  Dizziness.  Fainting or light-headedness.  Unexplained weakness. The monitor is wired to two electrodes placed on your chest. Electrodes are flat, sticky disks that attach to your skin. The monitor can be worn for up to 30 days. You will wear the monitor at all times, except when bathing.  HOW TO USE YOUR CARDIAC EVENT MONITOR A technician will prepare your chest for the electrode placement. The technician will show you how to place the electrodes, how to work the monitor, and how to replace the batteries. Take time to practice using the monitor before you leave the office. Make sure you understand how to send the information from the monitor to your health care provider. This requires a telephone with a landline, not a cell phone. You need to:  Wear your monitor at all times, except when you are in water:  Do not get the monitor wet.  Take the monitor off when bathing. Do not swim or use a hot tub with it on.  Keep your skin clean. Do not put body lotion or moisturizer on your  chest.  Change the electrodes daily or any time they stop sticking to your skin. You might need to use tape to keep them on.  It is possible that your skin under the electrodes could become irritated. To keep this from happening, try to put the electrodes in slightly different places on your chest. However, they must remain in the area under your left breast and in the upper right section of your chest.  Make sure the monitor is safely clipped to your clothing or in a location close to your body that your health care provider recommends.  Press the button to record when you feel symptoms of heart trouble, such as dizziness, weakness, light-headedness, palpitations, thumping, shortness of breath, unexplained weakness, or a fluttering or racing heart. The monitor is always on and records what happened slightly before you pressed the button, so do not worry about being too late to get good information.  Keep a diary of your activities, such as walking, doing chores, and taking medicine. It is especially important to note what you were doing when you pushed the button to record your symptoms. This will help your health care provider determine what might be contributing to your symptoms. The information stored in your monitor will be reviewed by your health care provider alongside your diary entries.  Send the recorded information as recommended by your health care provider. It is important to understand that it will take some time for your health care provider to process  the results.  Change the batteries as recommended by your health care provider. SEEK IMMEDIATE MEDICAL CARE IF:   You have chest pain.  You have extreme difficulty breathing or shortness of breath.  You develop a very fast heartbeat that persists.  You develop dizziness that does not go away.  You faint or constantly feel you are about to faint.   This information is not intended to replace advice given to you by your health  care provider. Make sure you discuss any questions you have with your health care provider.   Document Released: 10/05/2007 Document Revised: 01/16/2014 Document Reviewed: 06/24/2012 Elsevier Interactive Patient Education [employer].

## 2015-04-20 NOTE — Assessment & Plan Note (Signed)
Currently with no symptoms of angina. No further workup at this time. Continue current medication regimen. 

## 2015-04-24 ENCOUNTER — Other Ambulatory Visit: Payer: Self-pay | Admitting: Unknown Physician Specialty

## 2015-04-25 ENCOUNTER — Encounter (INDEPENDENT_AMBULATORY_CARE_PROVIDER_SITE_OTHER): Payer: Medicare Other

## 2015-04-25 DIAGNOSIS — I25810 Atherosclerosis of coronary artery bypass graft(s) without angina pectoris: Secondary | ICD-10-CM | POA: Diagnosis not present

## 2015-04-25 DIAGNOSIS — I10 Essential (primary) hypertension: Secondary | ICD-10-CM

## 2015-05-07 ENCOUNTER — Telehealth: Payer: Self-pay | Admitting: Cardiovascular Disease

## 2015-05-07 NOTE — Telephone Encounter (Signed)
Pt is asking if we have enough data from his event monitor  Would not like to wear it much longer Please advise.

## 2015-05-07 NOTE — Telephone Encounter (Signed)
Attempted to contact pt.  No answer, voice mail box is full.  

## 2015-05-10 NOTE — Telephone Encounter (Signed)
Spoke w/ pt.  He reports that he has been wearing the monitor for about 3 weeks now and wanted to see if we have enough info to take it off. He reports that his wife passed away a week and a half ago, he wore the monitor during all of her ceremonies. Advised him to try to wear it as long as he can, but if he cannot wear it the full 30 days, we understand. He is appreciative of the call and will try to wear it a few more days.

## 2015-05-14 ENCOUNTER — Emergency Department: Payer: Medicare Other

## 2015-05-14 ENCOUNTER — Encounter: Payer: Self-pay | Admitting: Emergency Medicine

## 2015-05-14 ENCOUNTER — Emergency Department
Admission: EM | Admit: 2015-05-14 | Discharge: 2015-05-14 | Disposition: A | Discharge status: Home / Self Care | Payer: Medicare Other | Attending: Emergency Medicine | Admitting: Emergency Medicine

## 2015-05-14 DIAGNOSIS — I10 Essential (primary) hypertension: Secondary | ICD-10-CM | POA: Insufficient documentation

## 2015-05-14 DIAGNOSIS — I2510 Atherosclerotic heart disease of native coronary artery without angina pectoris: Secondary | ICD-10-CM | POA: Insufficient documentation

## 2015-05-14 DIAGNOSIS — E785 Hyperlipidemia, unspecified: Secondary | ICD-10-CM | POA: Insufficient documentation

## 2015-05-14 DIAGNOSIS — Z87891 Personal history of nicotine dependence: Secondary | ICD-10-CM | POA: Diagnosis not present

## 2015-05-14 DIAGNOSIS — I252 Old myocardial infarction: Secondary | ICD-10-CM | POA: Diagnosis not present

## 2015-05-14 DIAGNOSIS — G459 Transient cerebral ischemic attack, unspecified: Secondary | ICD-10-CM

## 2015-05-14 DIAGNOSIS — Z79899 Other long term (current) drug therapy: Secondary | ICD-10-CM | POA: Diagnosis not present

## 2015-05-14 DIAGNOSIS — Z8546 Personal history of malignant neoplasm of prostate: Secondary | ICD-10-CM | POA: Insufficient documentation

## 2015-05-14 DIAGNOSIS — R4701 Aphasia: Secondary | ICD-10-CM | POA: Diagnosis present

## 2015-05-14 DIAGNOSIS — Z7982 Long term (current) use of aspirin: Secondary | ICD-10-CM | POA: Insufficient documentation

## 2015-05-14 LAB — CBC WITH DIFFERENTIAL/PLATELET
Basophils Absolute: 0 10*3/uL (ref 0–0.1)
Eosinophils Absolute: 0.2 10*3/uL (ref 0–0.7)
Eosinophils Relative: 3 %
HEMATOCRIT: 35.1 % — AB (ref 40.0–52.0)
Hemoglobin: 11.8 g/dL — ABNORMAL LOW (ref 13.0–18.0)
Lymphocytes Relative: 10 %
Lymphs Abs: 0.8 10*3/uL — ABNORMAL LOW (ref 1.0–3.6)
MCH: 32.1 pg (ref 26.0–34.0)
MCHC: 33.7 g/dL (ref 32.0–36.0)
MCV: 95.2 fL (ref 80.0–100.0)
MONO ABS: 1.1 10*3/uL — AB (ref 0.2–1.0)
NEUTROS ABS: 5.8 10*3/uL (ref 1.4–6.5)
Neutrophils Relative %: 72 %
Platelets: 131 10*3/uL — ABNORMAL LOW (ref 150–440)
RBC: 3.69 MIL/uL — ABNORMAL LOW (ref 4.40–5.90)
RDW: 13.6 % (ref 11.5–14.5)
WBC: 7.9 10*3/uL (ref 3.8–10.6)

## 2015-05-14 LAB — URINALYSIS COMPLETE WITH MICROSCOPIC (ARMC ONLY)
BACTERIA UA: NONE SEEN
BILIRUBIN URINE: NEGATIVE
GLUCOSE, UA: NEGATIVE mg/dL
HGB URINE DIPSTICK: NEGATIVE
Ketones, ur: NEGATIVE mg/dL
Leukocytes, UA: NEGATIVE
Nitrite: NEGATIVE
PH: 7 (ref 5.0–8.0)
Protein, ur: NEGATIVE mg/dL
Specific Gravity, Urine: 1.011 (ref 1.005–1.030)

## 2015-05-14 LAB — COMPREHENSIVE METABOLIC PANEL
ALBUMIN: 4 g/dL (ref 3.5–5.0)
ALT: 17 U/L (ref 17–63)
ANION GAP: 9 (ref 5–15)
AST: 27 U/L (ref 15–41)
Alkaline Phosphatase: 39 U/L (ref 38–126)
BILIRUBIN TOTAL: 1.4 mg/dL — AB (ref 0.3–1.2)
BUN: 41 mg/dL — ABNORMAL HIGH (ref 6–20)
CO2: 31 mmol/L (ref 22–32)
Calcium: 12.3 mg/dL — ABNORMAL HIGH (ref 8.9–10.3)
Chloride: 102 mmol/L (ref 101–111)
Creatinine, Ser: 2.21 mg/dL — ABNORMAL HIGH (ref 0.61–1.24)
GFR, EST AFRICAN AMERICAN: 29 mL/min — AB (ref >60)
GFR, EST NON AFRICAN AMERICAN: 25 mL/min — AB (ref >60)
Glucose, Bld: 101 mg/dL — ABNORMAL HIGH (ref 65–99)
POTASSIUM: 4.2 mmol/L (ref 3.5–5.1)
Sodium: 142 mmol/L (ref 135–145)
TOTAL PROTEIN: 7 g/dL (ref 6.5–8.1)

## 2015-05-14 LAB — TROPONIN I
TROPONIN I: 0.04 ng/mL — AB (ref <0.031)
Troponin I: 0.04 ng/mL — ABNORMAL HIGH (ref <0.031)

## 2015-05-14 MED ORDER — LOSARTAN POTASSIUM 25 MG PO TABS
25.0000 mg | ORAL_TABLET | Freq: Once | ORAL | Status: AC
  Administered 2015-05-14: 25 mg via ORAL

## 2015-05-14 MED ORDER — SODIUM CHLORIDE 0.9 % IV BOLUS (SEPSIS)
1000.0000 mL | Freq: Once | INTRAVENOUS | Status: AC
Start: 2015-05-14 — End: 2015-05-14
  Administered 2015-05-14: 1000 mL via INTRAVENOUS

## 2015-05-14 MED ORDER — CARVEDILOL 6.25 MG PO TABS
6.2500 mg | ORAL_TABLET | Freq: Once | ORAL | Status: AC
  Administered 2015-05-14: 6.25 mg via ORAL

## 2015-05-14 NOTE — ED Provider Notes (Signed)
[facility] Emergency Department Provider Note  Time seen: 4:04 PM  I have reviewed the triage vital signs and the nursing notes.   HISTORY  Chief Complaint Aphasia    HPI Jimmy Friedman is a 82 y.o. male with a past medical history of CAD, MI, hypertension, hyperlipidemia, recent CVA 1 month ago status post TPA, presents to the emergency department with word finding difficulty.According to the patient since his stroke 1 month ago he does have trouble articulating certain words, with word finding difficulty. He states this was much worse today around 12:00pm, however he feels since being in the emergency department his symptoms have improved. Patient states he knows what he is trying to say but he cannot speak it fluently comes out and chopped up words and phrases. When asked if this is new the patient states no this has been occurring for greater than 1 month but it was worse today, but now he feels like he is back at his baseline word finding difficulty. Denies any trouble walking, denies focal weakness or numbness, denies confusion or slurred speech. Patient also states he felt mildly short of breath when symptoms started at 12 PM but states that has completely resolved, denies any chest pain now or at any time.    Past Medical History  Diagnosis Date  . Coronary artery disease   . MI (myocardial infarction) ([place])   . S/P AVR (aortic valve replacement)   . Prostate cancer ([place])   . Kidney problem   . Hypertension   . Hyperlipidemia   . Chronic prostatitis   . TIA (transient ischemic attack)     Patient Active Problem List   Diagnosis Date Noted  . TIA (transient ischemic attack) 04/08/2015  . Chronic cough 03/02/2015  . Prostate cancer ([place]) 08/19/2014  . Chronic prostatitis 08/18/2014  . Coronary artery disease involving coronary bypass graft of native heart without angina pectoris 08/04/2014  . S/P CABG x 3 08/04/2014  . Hyperlipidemia  08/04/2014  . Essential hypertension 08/04/2014  . S/P aortic valve replacement with bioprosthetic valve 08/04/2014  . Thoracic aorta atherosclerosis ([place]) 08/04/2014    Past Surgical History  Procedure Laterality Date  . Coronary artery bypass graft    . Aortic valve replacement    . Kidney surgery      Current Outpatient Rx  Name  Route  Sig  Dispense  Refill  . aspirin 81 MG tablet   Oral   Take 81 mg by mouth daily.         Marland Kitchen CALCIUM-MAGNESIUM-ZINC PO   Oral   Take by mouth daily.         . carvedilol (COREG) 6.25 MG tablet   Oral   Take 1 tablet (6.25 mg total) by mouth 2 (two) times daily.   180 tablet   3   . Cholecalciferol (VITAMIN D3) 2000 UNITS TABS   Oral   Take 2,000 mg by mouth daily.         . Cholecalciferol (VITAMIN D3) 5000 units TABS   Oral   Take 40,000 Units by mouth daily.         . clotrimazole-betamethasone (LOTRISONE) cream   Topical   Apply 1 application topically 2 (two) times daily.   30 g   0   . doxycycline (VIBRA-TABS) 100 MG tablet      TAKE 1 TABLET BY MOUTH DAILY   30 tablet   0   . fluticasone (FLOVENT HFA) 220 MCG/ACT inhaler  Inhalation   Inhale into the lungs.         . furosemide (LASIX) 20 MG tablet   Oral   Take 1 tablet (20 mg total) by mouth daily as needed.   30 tablet   1   . Glucosamine-Chondroitin 500-400 MG CAPS   Oral   Take by mouth 2 (two) times daily.         Marland Kitchen losartan (COZAAR) 25 MG tablet   Oral   Take 1 tablet (25 mg total) by mouth daily.   90 tablet   3   . Multiple Vitamin (MULTIVITAMIN) tablet   Oral   Take 1 tablet by mouth daily.         . pravastatin (PRAVACHOL) 40 MG tablet      TAKE 1 TABLET EACH DAY   30 tablet   1   . vitamin C (ASCORBIC ACID) 500 MG tablet   Oral   Take 500 mg by mouth daily.         Marland Kitchen VITAMIN E PO   Oral   Take by mouth daily.           Allergies Penicillins and Sulfamethoxazole-trimethoprim  Family History  Problem  Relation Age of Onset  . Heart Problems Mother   . Osteoporosis Mother   . Heart Problems Father   . Hypertension Father   . Heart Problems Son     Social History Social History  Substance Use Topics  . Smoking status: Former Research scientist (life sciences)  . Smokeless tobacco: Never Used  . Alcohol Use: 0.0 oz/week    0 Standard drinks or equivalent per week     Comment: beer once a month    Review of Systems Constitutional: Negative for fever. Cardiovascular: Negative for chest pain. Respiratory: Mild shortness of breath which has resolved. Gastrointestinal: Negative for abdominal pain, vomiting and diarrhea. Musculoskeletal: Negative for back pain. Neurological: Negative for headaches, focal weakness or numbness. Positive for word finding difficulty.  10-point ROS otherwise negative.  ____________________________________________   PHYSICAL EXAM:  VITAL SIGNS: ED Triage Vitals  Enc Vitals Group     BP 05/14/15 1429 193/74 mmHg     Pulse Rate 05/14/15 1429 59     Resp 05/14/15 1429 31     Temp 05/14/15 1429 98.1 F (36.7 C)     Temp Source 05/14/15 1429 Oral     SpO2 05/14/15 1429 97 %     Weight 05/14/15 1429 151 lb 4 oz (68.607 kg)     Height 05/14/15 1429 5\' 10"  (1.778 m)     Head Cir --      Peak Flow --      Pain Score --      Pain Loc --      Pain Edu? --      Excl. in [place]? --     Constitutional: Alert and oriented. Well appearing and in no distress. Eyes: Normal exam ENT   Head: Normocephalic and atraumatic.   Mouth/Throat: Mucous membranes are moist. Cardiovascular: Normal rate, regular rhythm. No murmur Respiratory: Normal respiratory effort without tachypnea nor retractions. Breath sounds are clear  Gastrointestinal: Soft and nontender. No distention.  Musculoskeletal: Nontender with normal range of motion in all extremities.  Neurologic:  Normal speech and language. No gross focal neurologic deficits  Skin:  Skin is warm, dry and intact.  Psychiatric: Mood and  affect are normal.   ____________________________________________    EKG  EKG reviewed and interpreted by myself shows normal sinus  rhythm at 66 bpm, widened QRS, left axis deviation, consistent with likely left bundle branch block. Nonspecific but no concerning ST changes.  ____________________________________________    RADIOLOGY  Chest x-ray shows no acute abnormality. CT of the head shows no acute abnormality  ____________________________________________    INITIAL IMPRESSION / ASSESSMENT AND PLAN / ED COURSE  Pertinent labs & imaging results that were available during my care of the patient were reviewed by me and considered in my medical decision making (see chart for details).  Patient presents the emergency department with word finding difficulty. Patient states since his stroke 1 month ago he has word finding difficulty but felt it was worse today around 12 PM but is now improved close to his baseline word finding difficulty. Denies any focal weakness or numbness of any point today. Patient's labs show a mild creatinine elevation from a baseline around 1.4-1.8 to 2.2 today.  Troponin is slightly elevated 0.04 which could be due to the slight creatinine elevation. CBC is largely within normal limits. Given the patient's recent stroke, he could very likely suffered a TIA today. With his recent stroke workup I do not believe an admission to the hospital would necessarily be beneficial to the patient however the patient is suffering a new stroke I believe an admission to the hospital is warranted. I discussed the options with the patient, he is agreeable to MRI in the emergency department. Negative he will follow up with his neurologist, if positive we'll admit to the hospital. We will need a second troponin, and we'll continue to closely monitor the patient on a cardiac monitor while in the emergency department.  Patient's MRI shows no acute infarct. Currently pending repeat  troponin. If unchanged we'll discharge patient home with outpatient neurology follow-up. Patient agreeable plan.  Patient's MRI is normal. Repeat troponin is normal. Patient states he feels back to normal. Patient will call a family member for a guided home. We will discharge patient home with outpatient neurology follow-up. Patient agreeable to plan. She is hypertensive, states he will take his blood pressure medications when he gets home.  ____________________________________________   FINAL CLINICAL IMPRESSION(S) / ED DIAGNOSES  TIA   Harvest Dark, MD 05/14/15 [phone]

## 2015-05-14 NOTE — ED Notes (Signed)
Pt presents with initial complaint of respiratory difficulty but states that he has had difficulty speaking. States he can see the words but can't get them out for a second or two. Noticeable hesitation in speech, which he states started 2 days ago, and is worse today.

## 2015-05-14 NOTE — Discharge Instructions (Signed)
You have been seen in the emergency department today for word finding difficulty. As we have discussed your workup today as shown largely normal results including an MRI. Please follow-up with neurology by calling the number provided for the next available appointment. Return to the emergency department if you have any weakness, numbness, slurred speech, confusion, or any increased word finding difficulty.   Transient Ischemic Attack A transient ischemic attack (TIA) is a "warning stroke" that causes stroke-like symptoms. Unlike a stroke, a TIA does not cause permanent damage to the brain. The symptoms of a TIA can happen very fast and do not last long. It is important to know the symptoms of a TIA and what to do. This can help prevent a major stroke or death. CAUSES  A TIA is caused by a temporary blockage in an artery in the brain or neck (carotid artery). The blockage does not allow the brain to get the blood supply it needs and can cause different symptoms. The blockage can be caused by either:  A blood clot.  Fatty buildup (plaque) in a neck or brain artery. RISK FACTORS  High blood pressure (hypertension).  High cholesterol.  Diabetes mellitus.  Heart disease.  The buildup of plaque in the blood vessels (peripheral artery disease or atherosclerosis).  The buildup of plaque in the blood vessels that provide blood and oxygen to the brain (carotid artery stenosis).  An abnormal heart rhythm (atrial fibrillation).  Obesity.  Using any tobacco products, including cigarettes, chewing tobacco, or electronic cigarettes.  Taking oral contraceptives, especially in combination with using tobacco.  Physical inactivity.  A diet high in fats, salt (sodium), and calories.  Excessive alcohol use.  Use of illegal drugs (especially cocaine and methamphetamine).  Being male.  Being African American.  Being over the age of 89 years.  Family history of stroke.  Previous history of  blood clots, stroke, TIA, or heart attack.  Sickle cell disease. SIGNS AND SYMPTOMS  TIA symptoms are the same as a stroke but are temporary. These symptoms usually develop suddenly, or may be newly present upon waking from sleep:  Sudden weakness or numbness of the face, arm, or leg, especially on one side of the body.  Sudden trouble walking or difficulty moving arms or legs.  Sudden confusion.  Sudden personality changes.  Trouble speaking (aphasia) or understanding.  Difficulty swallowing.  Sudden trouble seeing in one or both eyes.  Double vision.  Dizziness.  Loss of balance or coordination.  Sudden severe headache with no known cause.  Trouble reading or writing.  Loss of bowel or bladder control.  Loss of consciousness. DIAGNOSIS  Your health care provider may be able to determine the presence or absence of a TIA based on your symptoms, history, and physical exam. CT scan of the brain is usually performed to help identify a TIA. Other tests may include:  Electrocardiography (ECG).  Continuous heart monitoring.  Echocardiography.  Carotid ultrasonography.  MRI.  A scan of the brain circulation.  Blood tests. TREATMENT  Since the symptoms of TIA are the same as a stroke, it is important to seek treatment as soon as possible. You may need a medicine to dissolve a blood clot (thrombolytic) if that is the cause of the TIA. This medicine cannot be given if too much time has passed. Treatment may also include:   Rest, oxygen, fluids through an IV tube, and medicines to thin the blood (anticoagulants).  Measures will be taken to prevent short-term and long-term  complications, including infection from breathing foreign material into the lungs (aspiration pneumonia), blood clots in the legs, and falls.  Procedures to either remove plaque in the carotid arteries or dilate carotid arteries that have narrowed due to plaque. Those procedures are:  Carotid  endarterectomy.  Carotid angioplasty and stenting.  Medicines and diet may be used to address diabetes, high blood pressure, and other underlying risk factors. HOME CARE INSTRUCTIONS   Take medicines only as directed by your health care provider. Follow the directions carefully. Medicines may be used to control risk factors for a stroke. Be sure you understand all your medicine instructions.  You may be told to take aspirin or the anticoagulant warfarin. Warfarin needs to be taken exactly as instructed.  Taking too much or too little warfarin is dangerous. Too much warfarin increases the risk of bleeding. Too little warfarin continues to allow the risk for blood clots. While taking warfarin, you will need to have regular blood tests to measure your blood clotting time. A PT blood test measures how long it takes for blood to clot. Your PT is used to calculate another value called an INR. Your PT and INR help your health care provider to adjust your dose of warfarin. The dose can change for many reasons. It is critically important that you take warfarin exactly as prescribed.  Many foods, especially foods high in vitamin K can interfere with warfarin and affect the PT and INR. Foods high in vitamin K include spinach, kale, broccoli, cabbage, collard and turnip greens, Brussels sprouts, peas, cauliflower, seaweed, and parsley, as well as beef and pork liver, green tea, and soybean oil. You should eat a consistent amount of foods high in vitamin K. Avoid major changes in your diet, or notify your health care provider before changing your diet. Arrange a visit with a dietitian to answer your questions.  Many medicines can interfere with warfarin and affect the PT and INR. You must tell your health care provider about any and all medicines you take; this includes all vitamins and supplements. Be especially cautious with aspirin and anti-inflammatory medicines. Do not take or discontinue any prescribed or  over-the-counter medicine except on the advice of your health care provider or pharmacist.  Warfarin can have side effects, such as excessive bruising or bleeding. You will need to hold pressure over cuts for longer than usual. Your health care provider or pharmacist will discuss other potential side effects.  Avoid sports or activities that may cause injury or bleeding.  Be careful when shaving, flossing your teeth, or handling sharp objects.  Alcohol can change the body's ability to handle warfarin. It is best to avoid alcoholic drinks or consume only very small amounts while taking warfarin. Notify your health care provider if you change your alcohol intake.  Notify your dentist or other health care providers before procedures.  Eat a diet that includes 5 or more servings of fruits and vegetables each day. This may reduce the risk of stroke. Certain diets may be prescribed to address high blood pressure, high cholesterol, diabetes, or obesity.  A diet low in sodium, saturated fat, trans fat, and cholesterol is recommended to manage high blood pressure.  A diet low in saturated fat, trans fat, and cholesterol, and high in fiber may control cholesterol levels.  A controlled-carbohydrate, controlled-sugar diet is recommended to manage diabetes.  A reduced-calorie diet that is low in sodium, saturated fat, trans fat, and cholesterol is recommended to manage obesity.  Maintain a healthy  weight.  Stay physically active. It is recommended that you get at least 30 minutes of activity on most or all days.  Do not use any tobacco products, including cigarettes, chewing tobacco, or electronic cigarettes. If you need help quitting, ask your health care provider.  Limit alcohol intake to no more than 1 drink per day for nonpregnant women and 2 drinks per day for men. One drink equals 12 ounces of beer, 5 ounces of wine, or 1 ounces of hard liquor.  Do not abuse drugs.  A safe home environment  is important to reduce the risk of falls. Your health care provider may arrange for specialists to evaluate your home. Having grab bars in the bedroom and bathroom is often important. Your health care provider may arrange for equipment to be used at home, such as raised toilets and a seat for the shower.  Follow all instructions for follow-up with your health care provider. This is very important. This includes any referrals and lab tests. Proper follow-up can prevent a stroke or another TIA from occurring. PREVENTION  The risk of a TIA can be decreased by appropriately treating high blood pressure, high cholesterol, diabetes, heart disease, and obesity, and by quitting smoking, limiting alcohol, and staying physically active. SEEK MEDICAL CARE IF:  You have personality changes.  You have difficulty swallowing.  You are seeing double.  You have dizziness.  You have a fever. SEEK IMMEDIATE MEDICAL CARE IF:  Any of the following symptoms may represent a serious problem that is an emergency. Do not wait to see if the symptoms will go away. Get medical help right away. Call your local emergency services (911 in [place] Do not drive yourself to the hospital.  You have sudden weakness or numbness of the face, arm, or leg, especially on one side of the body.  You have sudden trouble walking or difficulty moving arms or legs.  You have sudden confusion.  You have trouble speaking (aphasia) or understanding.  You have sudden trouble seeing in one or both eyes.  You have a loss of balance or coordination.  You have a sudden, severe headache with no known cause.  You have new chest pain or an irregular heartbeat.  You have a partial or total loss of consciousness. MAKE SURE YOU:   Understand these instructions.  Will watch your condition.  Will get help right away if you are not doing well or get worse.   This information is not intended to replace advice given to you by your health  care provider. Make sure you discuss any questions you have with your health care provider.   Document Released: 10/05/2004 Document Revised: 01/16/2014 Document Reviewed: 04/02/2013 Elsevier Interactive Patient Education [employer].

## 2015-05-16 ENCOUNTER — Emergency Department: Payer: Medicare Other

## 2015-05-16 ENCOUNTER — Inpatient Hospital Stay
Admission: EM | Admit: 2015-05-16 | Discharge: 2015-05-21 | DRG: 078 | Disposition: A | Discharge status: Hospice | Payer: Medicare Other | Attending: Internal Medicine | Admitting: Internal Medicine

## 2015-05-16 DIAGNOSIS — Z8546 Personal history of malignant neoplasm of prostate: Secondary | ICD-10-CM

## 2015-05-16 DIAGNOSIS — E785 Hyperlipidemia, unspecified: Secondary | ICD-10-CM | POA: Diagnosis present

## 2015-05-16 DIAGNOSIS — D649 Anemia, unspecified: Secondary | ICD-10-CM | POA: Diagnosis present

## 2015-05-16 DIAGNOSIS — Z882 Allergy status to sulfonamides status: Secondary | ICD-10-CM

## 2015-05-16 DIAGNOSIS — Z66 Do not resuscitate: Secondary | ICD-10-CM | POA: Diagnosis present

## 2015-05-16 DIAGNOSIS — I129 Hypertensive chronic kidney disease with stage 1 through stage 4 chronic kidney disease, or unspecified chronic kidney disease: Secondary | ICD-10-CM | POA: Diagnosis present

## 2015-05-16 DIAGNOSIS — I674 Hypertensive encephalopathy: Secondary | ICD-10-CM | POA: Diagnosis not present

## 2015-05-16 DIAGNOSIS — R05 Cough: Secondary | ICD-10-CM

## 2015-05-16 DIAGNOSIS — Z87891 Personal history of nicotine dependence: Secondary | ICD-10-CM

## 2015-05-16 DIAGNOSIS — I2510 Atherosclerotic heart disease of native coronary artery without angina pectoris: Secondary | ICD-10-CM | POA: Diagnosis present

## 2015-05-16 DIAGNOSIS — Z952 Presence of prosthetic heart valve: Secondary | ICD-10-CM

## 2015-05-16 DIAGNOSIS — I252 Old myocardial infarction: Secondary | ICD-10-CM

## 2015-05-16 DIAGNOSIS — Z515 Encounter for palliative care: Secondary | ICD-10-CM | POA: Diagnosis present

## 2015-05-16 DIAGNOSIS — Z951 Presence of aortocoronary bypass graft: Secondary | ICD-10-CM

## 2015-05-16 DIAGNOSIS — E860 Dehydration: Secondary | ICD-10-CM | POA: Diagnosis present

## 2015-05-16 DIAGNOSIS — N179 Acute kidney failure, unspecified: Secondary | ICD-10-CM | POA: Diagnosis present

## 2015-05-16 DIAGNOSIS — I160 Hypertensive urgency: Secondary | ICD-10-CM | POA: Diagnosis present

## 2015-05-16 DIAGNOSIS — Z8249 Family history of ischemic heart disease and other diseases of the circulatory system: Secondary | ICD-10-CM

## 2015-05-16 DIAGNOSIS — Z880 Allergy status to penicillin: Secondary | ICD-10-CM

## 2015-05-16 DIAGNOSIS — R059 Cough, unspecified: Secondary | ICD-10-CM

## 2015-05-16 DIAGNOSIS — N183 Chronic kidney disease, stage 3 (moderate): Secondary | ICD-10-CM | POA: Diagnosis present

## 2015-05-16 DIAGNOSIS — Z7982 Long term (current) use of aspirin: Secondary | ICD-10-CM

## 2015-05-16 DIAGNOSIS — F0390 Unspecified dementia without behavioral disturbance: Secondary | ICD-10-CM | POA: Diagnosis present

## 2015-05-16 DIAGNOSIS — G253 Myoclonus: Secondary | ICD-10-CM | POA: Diagnosis present

## 2015-05-16 DIAGNOSIS — Z8262 Family history of osteoporosis: Secondary | ICD-10-CM

## 2015-05-16 DIAGNOSIS — Z8673 Personal history of transient ischemic attack (TIA), and cerebral infarction without residual deficits: Secondary | ICD-10-CM

## 2015-05-16 LAB — MAGNESIUM: Magnesium: 2 mg/dL (ref 1.7–2.4)

## 2015-05-16 LAB — URINALYSIS COMPLETE WITH MICROSCOPIC (ARMC ONLY)
BILIRUBIN URINE: NEGATIVE
GLUCOSE, UA: NEGATIVE mg/dL
Hgb urine dipstick: NEGATIVE
KETONES UR: NEGATIVE mg/dL
Leukocytes, UA: NEGATIVE
Nitrite: NEGATIVE
Protein, ur: NEGATIVE mg/dL
SPECIFIC GRAVITY, URINE: 1.013 (ref 1.005–1.030)
SQUAMOUS EPITHELIAL / LPF: NONE SEEN
pH: 6 (ref 5.0–8.0)

## 2015-05-16 LAB — COMPREHENSIVE METABOLIC PANEL
ALBUMIN: 4.1 g/dL (ref 3.5–5.0)
ALK PHOS: 44 U/L (ref 38–126)
ALT: 19 U/L (ref 17–63)
ANION GAP: 8 (ref 5–15)
AST: 26 U/L (ref 15–41)
BILIRUBIN TOTAL: 1.7 mg/dL — AB (ref 0.3–1.2)
BUN: 39 mg/dL — AB (ref 6–20)
CALCIUM: 11.7 mg/dL — AB (ref 8.9–10.3)
CO2: 31 mmol/L (ref 22–32)
CREATININE: 2.12 mg/dL — AB (ref 0.61–1.24)
Chloride: 104 mmol/L (ref 101–111)
GFR calc Af Amer: 31 mL/min — ABNORMAL LOW (ref >60)
GFR calc non Af Amer: 27 mL/min — ABNORMAL LOW (ref >60)
GLUCOSE: 145 mg/dL — AB (ref 65–99)
Potassium: 3.4 mmol/L — ABNORMAL LOW (ref 3.5–5.1)
Sodium: 143 mmol/L (ref 135–145)
TOTAL PROTEIN: 7 g/dL (ref 6.5–8.1)

## 2015-05-16 LAB — CBC WITH DIFFERENTIAL/PLATELET
Basophils Absolute: 0 10*3/uL (ref 0–0.1)
Eosinophils Absolute: 0.2 10*3/uL (ref 0–0.7)
Eosinophils Relative: 3 %
HEMATOCRIT: 36.1 % — AB (ref 40.0–52.0)
HEMOGLOBIN: 11.8 g/dL — AB (ref 13.0–18.0)
Lymphocytes Relative: 12 %
Lymphs Abs: 0.9 10*3/uL — ABNORMAL LOW (ref 1.0–3.6)
MCH: 31.8 pg (ref 26.0–34.0)
MCHC: 32.7 g/dL (ref 32.0–36.0)
MCV: 97 fL (ref 80.0–100.0)
Monocytes Absolute: 1 10*3/uL (ref 0.2–1.0)
NEUTROS ABS: 5.8 10*3/uL (ref 1.4–6.5)
Platelets: 128 10*3/uL — ABNORMAL LOW (ref 150–440)
RBC: 3.72 MIL/uL — AB (ref 4.40–5.90)
RDW: 14 % (ref 11.5–14.5)
WBC: 8 10*3/uL (ref 3.8–10.6)

## 2015-05-16 LAB — TROPONIN I: Troponin I: 0.04 ng/mL — ABNORMAL HIGH (ref <0.031)

## 2015-05-16 MED ORDER — VITAMIN D (ERGOCALCIFEROL) 1.25 MG (50000 UNIT) PO CAPS
50000.0000 [IU] | ORAL_CAPSULE | Freq: Every day | ORAL | Status: DC
  Administered 2015-05-17 – 2015-05-18 (×2): 50000 [IU] via ORAL
  Filled 2015-05-16 (×3): qty 1

## 2015-05-16 MED ORDER — ONDANSETRON HCL 4 MG PO TABS: 4.0000 mg | ORAL_TABLET | Freq: Four times a day (QID) | ORAL | Status: DC | PRN

## 2015-05-16 MED ORDER — HYDRALAZINE HCL 20 MG/ML IJ SOLN
10.0000 mg | INTRAMUSCULAR | Status: DC | PRN
  Administered 2015-05-16 – 2015-05-18 (×2): 10 mg via INTRAVENOUS
  Filled 2015-05-16 (×2): qty 1

## 2015-05-16 MED ORDER — MAGNESIUM OXIDE 400 (241.3 MG) MG PO TABS
400.0000 mg | ORAL_TABLET | Freq: Every day | ORAL | Status: DC
  Administered 2015-05-17 – 2015-05-18 (×3): 400 mg via ORAL
  Filled 2015-05-16 (×3): qty 1

## 2015-05-16 MED ORDER — DOXYCYCLINE HYCLATE 100 MG PO TABS
100.0000 mg | ORAL_TABLET | Freq: Every day | ORAL | Status: DC
Start: 2015-05-17 — End: 2015-05-20
  Administered 2015-05-17 – 2015-05-18 (×2): 100 mg via ORAL
  Filled 2015-05-16 (×3): qty 1

## 2015-05-16 MED ORDER — VITAMIN D3 125 MCG (5000 UT) PO TABS: 20000.0000 [IU] | ORAL_TABLET | Freq: Two times a day (BID) | ORAL | Status: DC

## 2015-05-16 MED ORDER — SODIUM CHLORIDE 0.9 % IV SOLN
INTRAVENOUS | Status: DC
  Administered 2015-05-16: 22:00:00 via INTRAVENOUS

## 2015-05-16 MED ORDER — POTASSIUM CHLORIDE CRYS ER 20 MEQ PO TBCR
40.0000 meq | EXTENDED_RELEASE_TABLET | Freq: Once | ORAL | Status: AC
Start: 2015-05-16 — End: 2015-05-16
  Administered 2015-05-16: 40 meq via ORAL
  Filled 2015-05-16: qty 2

## 2015-05-16 MED ORDER — OXYCODONE HCL 5 MG PO TABS: 5.0000 mg | ORAL_TABLET | ORAL | Status: DC | PRN

## 2015-05-16 MED ORDER — CALCIUM CARBONATE ANTACID 500 MG PO CHEW
1.0000 | CHEWABLE_TABLET | Freq: Every day | ORAL | Status: DC
  Administered 2015-05-17 – 2015-05-18 (×3): 200 mg via ORAL
  Filled 2015-05-16 (×3): qty 1

## 2015-05-16 MED ORDER — ACETAMINOPHEN 325 MG PO TABS: 650.0000 mg | ORAL_TABLET | Freq: Four times a day (QID) | ORAL | Status: DC | PRN

## 2015-05-16 MED ORDER — MORPHINE SULFATE (PF) 2 MG/ML IV SOLN
2.0000 mg | INTRAVENOUS | Status: DC | PRN
Start: 2015-05-16 — End: 2015-05-20
  Administered 2015-05-20: 2 mg via INTRAVENOUS
  Filled 2015-05-16: qty 1

## 2015-05-16 MED ORDER — CALCIUM-MAGNESIUM-ZINC PO TABS: 1.0000 | ORAL_TABLET | Freq: Every day | ORAL | Status: DC

## 2015-05-16 MED ORDER — FLUTICASONE PROPIONATE HFA 220 MCG/ACT IN AERO: 1.0000 | INHALATION_SPRAY | Freq: Two times a day (BID) | RESPIRATORY_TRACT | Status: DC

## 2015-05-16 MED ORDER — LABETALOL HCL 5 MG/ML IV SOLN
INTRAVENOUS | Status: AC
  Administered 2015-05-16: 10 mg via INTRAVENOUS
  Filled 2015-05-16: qty 4

## 2015-05-16 MED ORDER — BUDESONIDE 0.25 MG/2ML IN SUSP
0.2500 mg | Freq: Two times a day (BID) | RESPIRATORY_TRACT | Status: DC
  Administered 2015-05-17 – 2015-05-20 (×8): 0.25 mg via RESPIRATORY_TRACT
  Filled 2015-05-16 (×9): qty 2

## 2015-05-16 MED ORDER — HEPARIN SODIUM (PORCINE) 5000 UNIT/ML IJ SOLN
5000.0000 [IU] | Freq: Three times a day (TID) | INTRAMUSCULAR | Status: DC
  Administered 2015-05-17 – 2015-05-20 (×9): 5000 [IU] via SUBCUTANEOUS
  Filled 2015-05-16 (×8): qty 1

## 2015-05-16 MED ORDER — CARVEDILOL 6.25 MG PO TABS
6.2500 mg | ORAL_TABLET | Freq: Two times a day (BID) | ORAL | Status: DC
  Administered 2015-05-17 – 2015-05-18 (×4): 6.25 mg via ORAL
  Filled 2015-05-16 (×4): qty 1

## 2015-05-16 MED ORDER — LABETALOL HCL 5 MG/ML IV SOLN
10.0000 mg | Freq: Once | INTRAVENOUS | Status: AC
  Administered 2015-05-16: 10 mg via INTRAVENOUS

## 2015-05-16 MED ORDER — GLUCOSAMINE-CHONDROITIN 500-400 MG PO TABS: 1.0000 | ORAL_TABLET | Freq: Two times a day (BID) | ORAL | Status: DC

## 2015-05-16 MED ORDER — LOSARTAN POTASSIUM 25 MG PO TABS: 25.0000 mg | ORAL_TABLET | Freq: Every day | ORAL | Status: DC

## 2015-05-16 MED ORDER — SODIUM CHLORIDE 0.9% FLUSH
3.0000 mL | Freq: Two times a day (BID) | INTRAVENOUS | Status: DC
  Administered 2015-05-17 – 2015-05-20 (×4): 3 mL via INTRAVENOUS

## 2015-05-16 MED ORDER — VITAMIN E 180 MG (400 UNIT) PO CAPS
400.0000 [IU] | ORAL_CAPSULE | Freq: Every day | ORAL | Status: DC
  Administered 2015-05-17 – 2015-05-18 (×3): 400 [IU] via ORAL
  Filled 2015-05-16 (×3): qty 1

## 2015-05-16 MED ORDER — CARVEDILOL 6.25 MG PO TABS
6.2500 mg | ORAL_TABLET | ORAL | Status: AC
  Administered 2015-05-16: 6.25 mg via ORAL
  Filled 2015-05-16: qty 1

## 2015-05-16 MED ORDER — ONDANSETRON HCL 4 MG/2ML IJ SOLN: 4.0000 mg | Freq: Four times a day (QID) | INTRAMUSCULAR | Status: DC | PRN

## 2015-05-16 MED ORDER — ADULT MULTIVITAMIN W/MINERALS CH
1.0000 | ORAL_TABLET | Freq: Every day | ORAL | Status: DC
  Administered 2015-05-17 – 2015-05-18 (×2): 1 via ORAL
  Filled 2015-05-16 (×2): qty 1

## 2015-05-16 MED ORDER — PRAVASTATIN SODIUM 20 MG PO TABS
40.0000 mg | ORAL_TABLET | Freq: Every day | ORAL | Status: DC
Start: 2015-05-16 — End: 2015-05-19
  Administered 2015-05-17 – 2015-05-18 (×3): 40 mg via ORAL
  Filled 2015-05-16 (×3): qty 2

## 2015-05-16 MED ORDER — ASPIRIN EC 81 MG PO TBEC
81.0000 mg | DELAYED_RELEASE_TABLET | Freq: Every day | ORAL | Status: DC
Start: 2015-05-17 — End: 2015-05-20
  Administered 2015-05-17 – 2015-05-18 (×2): 81 mg via ORAL
  Filled 2015-05-16 (×3): qty 1

## 2015-05-16 MED ORDER — ZINC SULFATE 220 (50 ZN) MG PO CAPS
220.0000 mg | ORAL_CAPSULE | Freq: Every day | ORAL | Status: DC
  Administered 2015-05-17 – 2015-05-18 (×3): 220 mg via ORAL
  Filled 2015-05-16 (×4): qty 1

## 2015-05-16 MED ORDER — VITAMIN C 500 MG PO TABS
1000.0000 mg | ORAL_TABLET | Freq: Every day | ORAL | Status: DC
Start: 2015-05-16 — End: 2015-05-20
  Administered 2015-05-17 – 2015-05-18 (×3): 1000 mg via ORAL
  Filled 2015-05-16 (×3): qty 2

## 2015-05-16 MED ORDER — ASPIRIN EC 81 MG PO TBEC: 81.0000 mg | DELAYED_RELEASE_TABLET | Freq: Every day | ORAL | Status: DC

## 2015-05-16 MED ORDER — ACETAMINOPHEN 650 MG RE SUPP
650.0000 mg | Freq: Four times a day (QID) | RECTAL | Status: DC | PRN
Start: 2015-05-16 — End: 2015-05-21

## 2015-05-16 NOTE — H&P (Signed)
[facility] [place] NAME: Jimmy Friedman    MR#:  [id number]  DATE OF BIRTH:  06-Jul-1927   DATE OF ADMISSION:  05/16/2015  PRIMARY CARE PHYSICIAN: Golden Pop, MD   REQUESTING/REFERRING PHYSICIAN: forbach  CHIEF COMPLAINT:   Chief Complaint  Patient presents with  . Memory Loss    HISTORY OF PRESENT ILLNESS:  Jimmy Friedman  is a 82 y.o. male with a known history of cva without residual deficit, essential hypertension presenting with memory and speech issues. He has been experiencing memory loss and difficulty speaking for 2 days. He knows what he wants to say but intermittently cannot express this. On arriavl to ED noted to be hypertensive SBP>200. He was evaluated in the ED at onset of symptoms but discharged home with negative work up and resolution of symptoms.   PAST MEDICAL HISTORY:   Past Medical History  Diagnosis Date  . Coronary artery disease   . MI (myocardial infarction) ([place])   . S/P AVR (aortic valve replacement)   . Prostate cancer ([place])   . Kidney problem   . Hypertension   . Hyperlipidemia   . Chronic prostatitis   . TIA (transient ischemic attack)     PAST SURGICAL HISTORY:   Past Surgical History  Procedure Laterality Date  . Coronary artery bypass graft    . Aortic valve replacement    . Kidney surgery      SOCIAL HISTORY:   Social History  Substance Use Topics  . Smoking status: Former Research scientist (life sciences)  . Smokeless tobacco: Never Used  . Alcohol Use: 0.0 oz/week    0 Standard drinks or equivalent per week     Comment: beer once a month    FAMILY HISTORY:   Family History  Problem Relation Age of Onset  . Heart Problems Mother   . Osteoporosis Mother   . Heart Problems Father   . Hypertension Father   . Heart Problems Son     DRUG ALLERGIES:   Allergies  Allergen Reactions  . Penicillins Swelling and Other (See Comments)    Reaction:  Feet swelling  Has patient had a PCN reaction  causing immediate rash, facial/tongue/throat swelling, SOB or lightheadedness with hypotension: No Has patient had a PCN reaction causing severe rash involving mucus membranes or skin necrosis: No Has patient had a PCN reaction that required hospitalization No Has patient had a PCN reaction occurring within the last 10 years: No If all of the above answers are "NO", then may proceed with Cephalosporin use.  . Sulfamethoxazole-Trimethoprim Rash    REVIEW OF SYSTEMS:  REVIEW OF SYSTEMS:  CONSTITUTIONAL: Denies fevers, chills, fatigue, weakness.  EYES: Denies blurred vision, double vision, or eye pain.  EARS, NOSE, THROAT: Denies tinnitus, ear pain, hearing loss.  RESPIRATORY: denies cough, shortness of breath, wheezing  CARDIOVASCULAR: Denies chest pain, palpitations, edema.  GASTROINTESTINAL: Denies nausea, vomiting, diarrhea, abdominal pain.  GENITOURINARY: Denies dysuria, hematuria.  ENDOCRINE: Denies nocturia or thyroid problems. HEMATOLOGIC AND LYMPHATIC: Denies easy bruising or bleeding.  SKIN: Denies rash or lesions.  MUSCULOSKELETAL: Denies pain in neck, back, shoulder, knees, hips, or further arthritic symptoms.  NEUROLOGIC: Denies paralysis, paresthesias.  PSYCHIATRIC: Denies anxiety or depressive symptoms. Otherwise full review of systems performed by me is negative.   MEDICATIONS AT HOME:   Prior to Admission medications   Medication Sig Start Date End Date Taking? Authorizing Provider  aspirin EC 81 MG tablet Take 81 mg by mouth daily.  Historical Provider, MD  carvedilol (COREG) 6.25 MG tablet Take 1 tablet (6.25 mg total) by mouth 2 (two) times daily. 08/04/14   Minna Merritts, MD  Cholecalciferol (VITAMIN D3) 5000 units TABS Take 20,000 Units by mouth 2 (two) times daily.    Historical Provider, MD  clotrimazole-betamethasone (LOTRISONE) cream Apply 1 application topically 2 (two) times daily. Patient not taking: Reported on 05/14/2015 03/02/15   Kathrine Haddock, NP    doxycycline (VIBRA-TABS) 100 MG tablet Take 100 mg by mouth daily.    Historical Provider, MD  fluticasone (FLOVENT HFA) 220 MCG/ACT inhaler Inhale 1 puff into the lungs 2 (two) times daily.     Historical Provider, MD  furosemide (LASIX) 20 MG tablet Take 20 mg by mouth daily as needed for edema.    Historical Provider, MD  glucosamine-chondroitin 500-400 MG tablet Take 1 tablet by mouth 2 (two) times daily.    Historical Provider, MD  losartan (COZAAR) 25 MG tablet Take 1 tablet (25 mg total) by mouth daily. 08/04/14   Minna Merritts, MD  Multiple Minerals (CALCIUM-MAGNESIUM-ZINC) TABS Take 1 tablet by mouth at bedtime.    Historical Provider, MD  Multiple Vitamin (MULTIVITAMIN WITH MINERALS) TABS tablet Take 1 tablet by mouth daily.    Historical Provider, MD  pravastatin (PRAVACHOL) 40 MG tablet Take 40 mg by mouth at bedtime.    Historical Provider, MD  vitamin C (ASCORBIC ACID) 500 MG tablet Take 1,000 mg by mouth at bedtime.     Historical Provider, MD  vitamin E 400 UNIT capsule Take 400 Units by mouth at bedtime.    Historical Provider, MD      VITAL SIGNS:  Blood pressure 207/91, pulse 64, temperature 97.5 F (36.4 C), resp. rate 28, height 5\' 10"  (1.778 m), weight 68.493 kg (151 lb), SpO2 93 %.  PHYSICAL EXAMINATION:  VITAL SIGNS: Filed Vitals:   05/16/15 1900 05/16/15 1930  BP: 211/99 207/91  Pulse: 64 64  Temp:    Resp: 24 28   GENERAL:82 y.o.male currently in minimal acute distress.  HEAD: Normocephalic, atraumatic.  EYES: Pupils equal, round, reactive to light. Extraocular muscles intact. No scleral icterus.  MOUTH: Moist mucosal membrane. Dentition intact. No abscess noted.  EAR, NOSE, THROAT: Clear without exudates. No external lesions.  NECK: Supple. No thyromegaly. No nodules. No JVD.  PULMONARY: Clear to ascultation, without wheeze rails or rhonci. No use of accessory muscles, Good respiratory effort. good air entry bilaterally CHEST: Nontender to palpation.   CARDIOVASCULAR: S1 and S2. Regular rate and rhythm. 3/6 systolic murmurs, rubs, or gallops. No edema. Pedal pulses 2+ bilaterally.  GASTROINTESTINAL: Soft, nontender, nondistended. No masses. Positive bowel sounds. No hepatosplenomegaly.  MUSCULOSKELETAL: No swelling, clubbing, or edema. Range of motion full in all extremities.  NEUROLOGIC: Cranial nerves II through XII are intact strength 5/5 proximal/distal/ flexion/extension. Sensation intact. Reflexes intact. Pronator drift within normal limits SKIN: No ulceration, lesions, rashes, or cyanosis. Skin warm and dry. Turgor intact.  PSYCHIATRIC: Mood, affect within normal limits. The patient is awake, alert and oriented x 3. Insight, judgment intact. Expressive aphasia with broken speech pattern   LABORATORY PANEL:   CBC  Recent Labs Lab 05/16/15 1656  WBC 8.0  HGB 11.8*  HCT 36.1*  PLT 128*   ------------------------------------------------------------------------------------------------------------------  Chemistries   Recent Labs Lab 05/16/15 1656  NA 143  K 3.4*  CL 104  CO2 31  GLUCOSE 145*  BUN 39*  CREATININE 2.12*  CALCIUM 11.7*  MG 2.0  AST  26  ALT 19  ALKPHOS 44  BILITOT 1.7*   ------------------------------------------------------------------------------------------------------------------  Cardiac Enzymes  Recent Labs Lab 05/16/15 1656  TROPONINI 0.04*   ------------------------------------------------------------------------------------------------------------------  RADIOLOGY:  Ct Head Wo Contrast  05/16/2015  CLINICAL DATA:  82 year old male with memory difficulty and recent TIA. EXAM: CT HEAD WITHOUT CONTRAST TECHNIQUE: Contiguous axial images were obtained from the base of the skull through the vertex without intravenous contrast. COMPARISON:  05/14/2015 and prior exams FINDINGS: Atrophy and mild chronic small-vessel white matter ischemic changes again noted. No acute intracranial abnormalities  are identified, including mass lesion or mass effect, hydrocephalus, extra-axial fluid collection, midline shift, hemorrhage, or acute infarction. The visualized bony calvarium is unremarkable. IMPRESSION: No evidence of acute intracranial abnormality. Atrophy and chronic small-vessel white matter ischemic changes. Electronically Signed   By: Margarette Canada M.D.   On: 05/16/2015 17:55    EKG:   Orders placed or performed during the hospital encounter of 05/14/15  . ED EKG  . ED EKG    IMPRESSION AND PLAN:   82 year old male history of stroke presenting with expressive aphaisa  1. Hypertensive urgency: restart home meds, add prn meds goal bp <180/100 2. Hypokalemia: replace 4-5 3.hyperlilpidemia unspecified: statin 4. Aki: gentle ivf 5. vte px: heparin   All the records are reviewed and case discussed with ED provider. Management plans discussed with the patient, family and they are in agreement.  CODE STATUS: full  TOTAL TIME TAKING CARE OF THIS PATIENT: 35 minutes.    Hower,  Karenann Cai.D on 05/16/2015 at 8:21 PM  Between 7am to 6pm - Pager - [phone]  After 6pm: House Pager: - [phone]  [facility]  Office  [phone]  CC: Primary care physician; Golden Pop, MD

## 2015-05-16 NOTE — ED Notes (Signed)
Floor refusing patient to to BP. Floor RN wants pt's BP monitored to make sure pt remains stable.

## 2015-05-16 NOTE — ED Notes (Signed)
Per EMS: Pt d/c'd from hospital with diagnosis of TIA 2 days ago. Pt unable to remember to take medications, or why he went into room, where he left things. Pt had similar symptoms when diagnosed with TIA. Pt's wife died 1 week ago - wife normally managed day-to-day activities. Pt ambulates with steady gate, equal grip strength, no neurological deficits noted.  Per patient:"I keep forgetting things like when I had my stroke". Pt also reports he is supposed to be on cardiac monitor, however EMS took it off him for transport

## 2015-05-16 NOTE — ED Notes (Signed)
MD Forbach at bedside. 

## 2015-05-16 NOTE — Care Management Obs Status (Signed)
[facility] NOTIFICATION   Patient Details  Name: BURNEST BRESS MRN: [id number] Date of Birth: [birth year]   Medicare Observation Status Notification Given:  Yes, Understands he is being admitted for Hypertensive Urgency    Kennedie Pardoe, Antony Haste, RN 05/16/2015, 8:23 PM

## 2015-05-16 NOTE — ED Notes (Signed)
Patient transported to CT 

## 2015-05-16 NOTE — ED Provider Notes (Signed)
[facility] Emergency Department Provider Note  ____________________________________________  Time seen: Approximately 4:50 PM  I have reviewed the triage vital signs and the nursing notes.   HISTORY  Chief Complaint Memory Loss    HPI Jimmy Friedman is a 82 y.o. male with a history of CVA/TIA seen at [facility] and transferred to [facility] just over a month ago . No significant abnormality was found on his workup.  He was seen again in this ED two days ago with certain for worsening aphasia similar to his prior stroke.  He also has some intermittent memory issues.  He was worked up in the ED 2 days ago and no significant abnormality was discovered and he was discharged to follow-up with neurology.  Today he continues to have memory deficits.  He is not able to keep track of his medications and sometimes forgets what he is doing in the process of doing it.  Today he went to dial the telephone to call his daughter and then forgot the second grouping of numbers and was concerned he was having a stroke so he called EMS.  At no point did he have any focal numbness nor weakness.  He is not having any difficulty with ambulation.  And no coordination difficulties.   Past Medical History  Diagnosis Date  . Coronary artery disease   . MI (myocardial infarction) ([place])   . S/P AVR (aortic valve replacement)   . Prostate cancer ([place])   . Kidney problem   . Hypertension   . Hyperlipidemia   . Chronic prostatitis   . TIA (transient ischemic attack)     Patient Active Problem List   Diagnosis Date Noted  . Hypertensive urgency 05/16/2015  . Chronic cough 03/02/2015  . Prostate cancer ([place]) 08/19/2014  . Chronic prostatitis 08/18/2014  . Coronary artery disease involving coronary bypass graft of native heart without angina pectoris 08/04/2014  . S/P CABG x 3 08/04/2014  . Hyperlipidemia 08/04/2014  . Essential hypertension 08/04/2014  . S/P aortic valve replacement with  bioprosthetic valve 08/04/2014  . Thoracic aorta atherosclerosis ([place]) 08/04/2014    Past Surgical History  Procedure Laterality Date  . Coronary artery bypass graft    . Aortic valve replacement    . Kidney surgery      No current outpatient prescriptions on file.  Allergies Penicillins and Sulfamethoxazole-trimethoprim  Family History  Problem Relation Age of Onset  . Heart Problems Mother   . Osteoporosis Mother   . Heart Problems Father   . Hypertension Father   . Heart Problems Son     Social History Social History  Substance Use Topics  . Smoking status: Former Research scientist (life sciences)  . Smokeless tobacco: Never Used  . Alcohol Use: 0.0 oz/week    0 Standard drinks or equivalent per week     Comment: beer once a month    Review of Systems Constitutional: No fever/chills Eyes: No visual changes. ENT: No sore throat. Cardiovascular: Denies chest pain. Respiratory: Denies shortness of breath. Gastrointestinal: No abdominal pain.  No nausea, no vomiting.  No diarrhea.  No constipation. Genitourinary: Negative for dysuria. Musculoskeletal: Negative for back pain. Skin: Negative for rash. Neurological: Difficulties with memory and confusion that seem intermittent.  10-point ROS otherwise negative.  ____________________________________________   PHYSICAL EXAM:  VITAL SIGNS: ED Triage Vitals  Enc Vitals Group     BP 05/16/15 1645 168/67 mmHg     Pulse Rate 05/16/15 1645 58     Resp --  Temp 05/16/15 1645 97.5 F (36.4 C)     Temp src --      SpO2 05/16/15 1645 94 %     Weight 05/16/15 1645 151 lb (68.493 kg)     Height 05/16/15 1645 5\' 10"  (1.778 m)     Head Cir --      Peak Flow --      Pain Score --      Pain Loc --      Pain Edu? --      Excl. in [place]? --     Constitutional: Alert and oriented. Well appearing and in no acute distress. Eyes: Conjunctivae are normal. PERRL. EOMI. Head: Atraumatic. Nose: No congestion/rhinnorhea. Mouth/Throat: Mucous  membranes are moist.  Oropharynx non-erythematous. Neck: No stridor.  No meningeal signs.   Cardiovascular: Normal rate, regular rhythm. Good peripheral circulation. Grossly normal heart sounds.   Respiratory: Normal respiratory effort.  No retractions. Lungs CTAB. Gastrointestinal: Soft and nontender. No distention.  Musculoskeletal: No lower extremity tenderness nor edema. No gross deformities of extremities. Neurologic:  Essentially normal speech and language although he does occasionally have some word finding difficulties.  He has a mild resting tremor that he states is chronic.  He has equal grip strength bilaterally and has more muscle strength in all major muscle groups of his upper and lower extremities.  He has no dysmetria.  He has no facial droop or other obvious cranial nerve deficits. Skin:  Skin is warm, dry and intact. No rash noted. Psychiatric: Mood and affect are normal. Speech and behavior are normal.  ____________________________________________   LABS (all labs ordered are listed, but only abnormal results are displayed)  Labs Reviewed  CBC WITH DIFFERENTIAL/PLATELET - Abnormal; Notable for the following:    RBC 3.72 (*)    Hemoglobin 11.8 (*)    HCT 36.1 (*)    Platelets 128 (*)    Lymphs Abs 0.9 (*)    All other components within normal limits  COMPREHENSIVE METABOLIC PANEL - Abnormal; Notable for the following:    Potassium 3.4 (*)    Glucose, Bld 145 (*)    BUN 39 (*)    Creatinine, Ser 2.12 (*)    Calcium 11.7 (*)    Total Bilirubin 1.7 (*)    GFR calc non Af Amer 27 (*)    GFR calc Af Amer 31 (*)    All other components within normal limits  TROPONIN I - Abnormal; Notable for the following:    Troponin I 0.04 (*)    All other components within normal limits  URINALYSIS COMPLETEWITH MICROSCOPIC ([facility] ONLY) - Abnormal; Notable for the following:    Color, Urine YELLOW (*)    APPearance CLEAR (*)    Bacteria, UA RARE (*)    All other components within  normal limits  MAGNESIUM  BASIC METABOLIC PANEL  CBC   ____________________________________________  EKG  ED ECG REPORT I, Virga Haltiwanger, the attending physician, personally viewed and interpreted this ECG.   Date: 05/16/2015  EKG Time: 16:40  Rate: 66  Rhythm: normal sinus rhythm  Axis: Left axis deviation  Intervals:left bundle branch block  ST&T Change: Occasional multiform ventricular premature complexes, left bundle branch is different from his prior EKGs which showed right bundle branch but the narrative interpretation from his EKG admission demonstrated bifascicular block.  ____________________________________________  RADIOLOGY   Ct Head Wo Contrast  05/16/2015  CLINICAL DATA:  82 year old male with memory difficulty and recent TIA. EXAM: CT HEAD WITHOUT CONTRAST  TECHNIQUE: Contiguous axial images were obtained from the base of the skull through the vertex without intravenous contrast. COMPARISON:  05/14/2015 and prior exams FINDINGS: Atrophy and mild chronic small-vessel white matter ischemic changes again noted. No acute intracranial abnormalities are identified, including mass lesion or mass effect, hydrocephalus, extra-axial fluid collection, midline shift, hemorrhage, or acute infarction. The visualized bony calvarium is unremarkable. IMPRESSION: No evidence of acute intracranial abnormality. Atrophy and chronic small-vessel white matter ischemic changes. Electronically Signed   By: Margarette Canada M.D.   On: 05/16/2015 17:55    ____________________________________________   PROCEDURES  Procedure(s) performed: None  Critical Care performed: No ____________________________________________   INITIAL IMPRESSION / ASSESSMENT AND PLAN / ED COURSE  Pertinent labs & imaging results that were available during my care of the patient were reviewed by me and considered in my medical decision making (see chart for details).  The patient is having no shortness of breath, no  chest pain, no abdominal pain, and no nausea/vomiting/diarrhea.  He has no acute neurological deficits that I can appreciate at this time and has an NIH stroke scale of 0.  He does have significant hypertension but is similar to the hypertension he had 2 days ago during his last emergency department visit.  I evaluated him with blood work and a noncontrast head CT but I think many of his symptoms are due to anxiety and worry as well as chronic memory loss and the recent loss of his wife as well as his recent stroke.  I called and spoke by phone with the on-call neurologist at [facility] and discussed the case with her.  (We did not specifically discuss the hypertension.)  Based on the information provided by phone, she agreed that the patient needs to follow up at the next available opportunity at [facility] but that he does not appear to require admission at this time.  She is going to leave a note for his neurologist and also see if one of their stroke nurses can call and touch base with him regularly to provide some reassurance or guidance.  Because one of his issues is remembering to take his medications, I am giving him an extra dose of Coreg 6.25 mg and we will reassess his blood pressure.   (Note that documentation was delayed due to multiple ED patients requiring immediate care.)  The patient's blood pressure remained extremely hypertensive after Coreg.  Given patient's persistent uncontrolled hypertension and questionable confusion/encephalopathy, I will admit for hypertension management and neurology consultation.  Giving labetalol 10 mg IV (concerned that the BP will be very labile and that he will be extremely sensitive to medicaitons).  Discussed with Dr. Lavetta Nielsen (hospitalist) who will admit, and the patient agrees with the plan as well.  Jonni Sanger [phone]) is the patient's daughter (lives in [place]) and [place].  I spoke with her by phone and updated her with all the information discussed above.  She  agrees with the plan and will check in with the patient tomorrow.  ____________________________________________  FINAL CLINICAL IMPRESSION(S) / ED DIAGNOSES  Final diagnoses:  Hypertensive urgency  Hypertensive encephalopathy  History of CVA (cerebrovascular accident)      Note:  This document was prepared using Dragon voice recognition software and may include unintentional dictation errors.   Hinda Kehr, MD 05/16/15 [phone]

## 2015-05-16 NOTE — ED Notes (Signed)
Pt's BP decreased significantly after administering hydralizine. MD Jannifer Franklin notified. Continuous NaCL started at prescribed 50 mL/h

## 2015-05-17 LAB — CBC
HEMATOCRIT: 32.1 % — AB (ref 40.0–52.0)
HEMOGLOBIN: 11.1 g/dL — AB (ref 13.0–18.0)
MCH: 32.7 pg (ref 26.0–34.0)
MCHC: 34.5 g/dL (ref 32.0–36.0)
MCV: 94.9 fL (ref 80.0–100.0)
PLATELETS: 123 10*3/uL — AB (ref 150–440)
RBC: 3.38 MIL/uL — AB (ref 4.40–5.90)
RDW: 13.7 % (ref 11.5–14.5)
WBC: 7.6 10*3/uL (ref 3.8–10.6)

## 2015-05-17 LAB — BASIC METABOLIC PANEL
ANION GAP: 7 (ref 5–15)
BUN: 37 mg/dL — ABNORMAL HIGH (ref 6–20)
CALCIUM: 11.6 mg/dL — AB (ref 8.9–10.3)
CHLORIDE: 108 mmol/L (ref 101–111)
CO2: 28 mmol/L (ref 22–32)
Creatinine, Ser: 2.01 mg/dL — ABNORMAL HIGH (ref 0.61–1.24)
GFR calc non Af Amer: 28 mL/min — ABNORMAL LOW (ref >60)
GFR, EST AFRICAN AMERICAN: 33 mL/min — AB (ref >60)
Glucose, Bld: 126 mg/dL — ABNORMAL HIGH (ref 65–99)
POTASSIUM: 3.8 mmol/L (ref 3.5–5.1)
Sodium: 143 mmol/L (ref 135–145)

## 2015-05-17 MED ORDER — LOSARTAN POTASSIUM 50 MG PO TABS: 50.0000 mg | ORAL_TABLET | Freq: Every day | ORAL | Status: DC

## 2015-05-17 MED ORDER — LOSARTAN POTASSIUM 50 MG PO TABS
100.0000 mg | ORAL_TABLET | Freq: Every day | ORAL | Status: DC
  Administered 2015-05-17: 100 mg via ORAL
  Filled 2015-05-17: qty 2

## 2015-05-17 MED ORDER — LOSARTAN POTASSIUM 50 MG PO TABS
50.0000 mg | ORAL_TABLET | Freq: Every day | ORAL | Status: DC
  Administered 2015-05-18: 50 mg via ORAL
  Filled 2015-05-17: qty 1

## 2015-05-17 NOTE — Progress Notes (Signed)
[place] [facility] [place] NAME: Jimmy Friedman    MR#:  [id number]  DATE OF BIRTH:  1927-03-03  SUBJECTIVE:  CHIEF COMPLAINT:   Chief Complaint  Patient presents with  . Memory Loss   Problems at this time. Blood pressure improved. No focal weakness or numbness.  REVIEW OF SYSTEMS:    Review of Systems  Constitutional: Negative for fever and chills.  HENT: Negative for sore throat.   Eyes: Negative for blurred vision, double vision and pain.  Respiratory: Negative for cough, hemoptysis, shortness of breath and wheezing.   Cardiovascular: Negative for chest pain, palpitations, orthopnea and leg swelling.  Gastrointestinal: Negative for heartburn, nausea, vomiting, abdominal pain, diarrhea and constipation.  Genitourinary: Negative for dysuria and hematuria.  Musculoskeletal: Negative for back pain and joint pain.  Skin: Negative for rash.  Neurological: Negative for sensory change, speech change, focal weakness and headaches.  Endo/Heme/Allergies: Does not bruise/bleed easily.  Psychiatric/Behavioral: Positive for memory loss. Negative for depression. The patient is not nervous/anxious.     DRUG ALLERGIES:   Allergies  Allergen Reactions  . Penicillins Swelling and Other (See Comments)    Reaction:  Feet swelling  Has patient had a PCN reaction causing immediate rash, facial/tongue/throat swelling, SOB or lightheadedness with hypotension: No Has patient had a PCN reaction causing severe rash involving mucus membranes or skin necrosis: No Has patient had a PCN reaction that required hospitalization No Has patient had a PCN reaction occurring within the last 10 years: No If all of the above answers are "NO", then may proceed with Cephalosporin use.  . Sulfamethoxazole-Trimethoprim Rash    VITALS:  Blood pressure 166/65, pulse 61, temperature 98.1 F (36.7 C), temperature source Oral, resp. rate 18, height 5\' 10"  (1.778 m),  weight 68.493 kg (151 lb), SpO2 96 %.  PHYSICAL EXAMINATION:   Physical Exam  GENERAL:  82 y.o.-year-old patient lying in the bed with no acute distress.  EYES: Pupils equal, round, reactive to light and accommodation. No scleral icterus. Extraocular muscles intact.  HEENT: Head atraumatic, normocephalic. Oropharynx and nasopharynx clear.  NECK:  Supple, no jugular venous distention. No thyroid enlargement, no tenderness.  LUNGS: Normal breath sounds bilaterally, no wheezing, rales, rhonchi. No use of accessory muscles of respiration.  CARDIOVASCULAR: S1, S2 normal. No murmurs, rubs, or gallops.  ABDOMEN: Soft, nontender, nondistended. Bowel sounds present. No organomegaly or mass.  EXTREMITIES: No cyanosis, clubbing or edema b/l.    NEUROLOGIC: Cranial nerves II through XII are intact. No focal Motor or sensory deficits b/l.   PSYCHIATRIC: The patient is alert and oriented x 3.  SKIN: No obvious rash, lesion, or ulcer.   LABORATORY PANEL:   CBC  Recent Labs Lab 05/17/15 0436  WBC 7.6  HGB 11.1*  HCT 32.1*  PLT 123*   ------------------------------------------------------------------------------------------------------------------ Chemistries   Recent Labs Lab 05/16/15 1656 05/17/15 0436  NA 143 143  K 3.4* 3.8  CL 104 108  CO2 31 28  GLUCOSE 145* 126*  BUN 39* 37*  CREATININE 2.12* 2.01*  CALCIUM 11.7* 11.6*  MG 2.0  --   AST 26  --   ALT 19  --   ALKPHOS 44  --   BILITOT 1.7*  --    ------------------------------------------------------------------------------------------------------------------  Cardiac Enzymes  Recent Labs Lab 05/16/15 1656  TROPONINI 0.04*   ------------------------------------------------------------------------------------------------------------------  RADIOLOGY:  Ct Head Wo Contrast  05/16/2015  CLINICAL DATA:  82 year old male with memory difficulty and recent TIA. EXAM:  CT HEAD WITHOUT CONTRAST TECHNIQUE: Contiguous axial  images were obtained from the base of the skull through the vertex without intravenous contrast. COMPARISON:  05/14/2015 and prior exams FINDINGS: Atrophy and mild chronic small-vessel white matter ischemic changes again noted. No acute intracranial abnormalities are identified, including mass lesion or mass effect, hydrocephalus, extra-axial fluid collection, midline shift, hemorrhage, or acute infarction. The visualized bony calvarium is unremarkable. IMPRESSION: No evidence of acute intracranial abnormality. Atrophy and chronic small-vessel white matter ischemic changes. Electronically Signed   By: Margarette Canada M.D.   On: 05/16/2015 17:55     ASSESSMENT AND PLAN:   * Accelerated hypertension This is due to patient's cognitive impairment from the recent stroke. Advised a pillbox. Blood pressure improved once he has been restarted on his home medications. Increase losartan from 25 mg to 50 mg. Discussed with case manager and will set up home health. Patient is unsafe to discharge at this time as he lives alone with no support.  Discussed with daughter over the phone in detail was sitting up sitters who will be available from tomorrow. Discharge tomorrow morning once blood pressure is improved. Advised patient not to drive until he follows with neurologist in 1 week at [facility].  * Cognitive impairment due to recent CVA Stable. Continue aspirin, statin.  * CKD stage III is stable.  * DVT prophylaxis with Lovenox  All the records are reviewed and case discussed with Care Management/Social Workerr. Management plans discussed with the patient, family and they are in agreement.  CODE STATUS: FULL CODE  DVT Prophylaxis: SCDs  TOTAL TIME TAKING CARE OF THIS PATIENT: 40 minutes.  < 50% time spent in co-ordinating care  POSSIBLE D/C IN 1-2 DAYS, DEPENDING ON CLINICAL CONDITION.  Hillary Bow R M.D on 05/17/2015 at 12:21 PM  Between 7am to 6pm - Pager - [phone]  After 6pm go to  [email or web address] - password [email or web address]  Office  [phone]  CC: Primary care physician; Golden Pop, MD  Note: This dictation was prepared with Dragon dictation along with smaller phrase technology. Any transcriptional errors that result from this process are unintentional.

## 2015-05-17 NOTE — Progress Notes (Signed)
Pt. Arrived to floor via stretcher. Pt. Walked from stretcher to bed without staff assistance. Tele applied. Running NS rhythm. Tele box 27. 2nd verifying nurse Baldo Ash, RN. Skin assessment completed pt. Has eczema to bilateral buttocks and BLE. Skin is dry and flaky. No other skin issues noted. Pt. Is visibly shaking, stated he was cold. Extra blankets applied to him. General room orientation given. How to use call bell and ascom system. Pt. Is alert and oriented. No SOB or acute distress noted. Will continue to monitor pt.

## 2015-05-17 NOTE — Care Management Note (Signed)
Case Management Note  Patient Details  Name: DOSS MCCORMACK MRN: [id number] Date of Birth: [birth year]  Subjective/Objective:      Spoke on phone with Mr Betton's daughter Jonni Sanger in [place] ph:[phone].  CM provided Mrs Carlis Abbott with a list of local personal care providers such as [facility], [employer], [employer], etc. She will call to arrange in home services with one of these agencies for Mr Hulvey in his home after this hospital discharge. Per Dr Darvin Neighbours, Mr Huey will be discharged home tomorrow.              Action/Plan:   Expected Discharge Date:                  Expected Discharge Plan:     In-House Referral:     Discharge planning Services     Post Acute Care Choice:    Choice offered to:     DME Arranged:    DME Agency:     HH Arranged:    [place] Agency:     Status of Service:     Medicare Important Message Given:    Date Medicare IM Given:    Medicare IM give by:    Date Additional Medicare IM Given:    Additional Medicare Important Message give by:     If discussed at [place] of Stay Meetings, dates discussed:    Additional Comments:  Zakariyya Helfman A, RN 05/17/2015, 11:00 AM

## 2015-05-17 NOTE — Care Management Note (Addendum)
Case Management Note  Patient Details  Name: LADAMION KEUSCH MRN: [id number] Date of Birth: 06-08-1927  Subjective/Objective:     Daughter Egbert Garibaldi called and has arranged for [facility] to provide daily visits by personal care nurse aides for Mr Drzewiecki in his home beginning Wed 05/19/15.  Mr Frelich will be transported home by his son Dalia Heading [facility]) [phone] on Tuesday 05/18/15. An RN from [place] will see Mr Pelley in his home Tuesday at 4pm to provide an admission assessment. Mrs Carlis Abbott requests that the CM call her at ph: [phone] as soon as a discharge order is written for discharge home on Tuesday. A heads up referral has been called to [facility] for a HH-RN.               Daily visits  Action/Plan:   Expected Discharge Date:                  Expected Discharge Plan:     In-House Referral:     Discharge planning Services     Post Acute Care Choice:    Choice offered to:     DME Arranged:    DME Agency:     HH Arranged:    HH Agency:     Status of Service:     Medicare Important Message Given:    Date Medicare IM Given:    Medicare IM give by:    Date Additional Medicare IM Given:    Additional Medicare Important Message give by:     If discussed at [place] of Stay Meetings, dates discussed:    Additional Comments:  Elaiza Shoberg A, RN 05/17/2015, 11:13 AM

## 2015-05-17 NOTE — Clinical Social Work Note (Signed)
CSW consulted due to patient not being able to manage his medications now that his wife recently past. His wife was the one who was able to provide this assistance in the past. RN CM has spoken to patient's daughter and she has arranged for in home sitters for patient and RN CM has arranged home health. CSW signing off at this time. Shela Leff MSW,LCSW [phone]

## 2015-05-17 NOTE — Discharge Instructions (Addendum)
°  DIET:  Cardiac diet  DISCHARGE CONDITION:  Stable  ACTIVITY:  Activity as tolerated  OXYGEN:  Home Oxygen: No.   Oxygen Delivery: room air  DISCHARGE LOCATION:  home   If you experience worsening of your admission symptoms, develop shortness of breath, life threatening emergency, suicidal or homicidal thoughts you must seek medical attention immediately by calling 911 or calling your MD immediately  if symptoms less severe.  You Must read complete instructions/literature along with all the possible adverse reactions/side effects for all the Medicines you take and that have been prescribed to you. Take any new Medicines after you have completely understood and accpet all the possible adverse reactions/side effects.   Please note  You were cared for by a hospitalist during your hospital stay. If you have any questions about your discharge medications or the care you received while you were in the hospital after you are discharged, you can call the unit and asked to speak with the hospitalist on call if the hospitalist that took care of you is not available. Once you are discharged, your primary care physician will handle any further medical issues. Please note that NO REFILLS for any discharge medications will be authorized once you are discharged, as it is imperative that you return to your primary care physician (or establish a relationship with a primary care physician if you do not have one) for your aftercare needs so that they can reassess your need for medications and monitor your lab values.  Do not drive till you get the OK when you see the Neurologist.

## 2015-05-18 ENCOUNTER — Telehealth: Payer: Self-pay | Admitting: Cardiovascular Disease

## 2015-05-18 ENCOUNTER — Observation Stay: Payer: Medicare Other

## 2015-05-18 DIAGNOSIS — I674 Hypertensive encephalopathy: Secondary | ICD-10-CM | POA: Diagnosis not present

## 2015-05-18 DIAGNOSIS — R4182 Altered mental status, unspecified: Secondary | ICD-10-CM

## 2015-05-18 MED ORDER — CARVEDILOL 12.5 MG PO TABS
12.5000 mg | ORAL_TABLET | Freq: Two times a day (BID) | ORAL | Status: DC
  Administered 2015-05-18: 12.5 mg via ORAL
  Filled 2015-05-18: qty 1

## 2015-05-18 MED ORDER — LOSARTAN POTASSIUM 50 MG PO TABS
50.0000 mg | ORAL_TABLET | Freq: Two times a day (BID) | ORAL | Status: DC
  Administered 2015-05-18 – 2015-05-19 (×2): 50 mg via ORAL
  Filled 2015-05-18 (×2): qty 1

## 2015-05-18 MED ORDER — CARVEDILOL 6.25 MG PO TABS
6.2500 mg | ORAL_TABLET | Freq: Once | ORAL | Status: DC
  Filled 2015-05-18: qty 1

## 2015-05-18 MED ORDER — CARVEDILOL 12.5 MG PO TABS: 12.5000 mg | ORAL_TABLET | Freq: Two times a day (BID) | ORAL | Status: DC

## 2015-05-18 NOTE — Care Management (Signed)
Case discussed with PT and DR. Patel. Patient medically stable for discharge. Orders for Home Health RN, PT and OT. Daughter is in agreement with POC. She has ask Dr. Posey Pronto to come and evaluate patients BP one more time prior to discharge. Corene Cornea with [facility] notified of additional home health orders. DC home with [facility] RN,  PT and OT pending MD eval.

## 2015-05-18 NOTE — Care Management (Signed)
PT in with patient now

## 2015-05-18 NOTE — Evaluation (Signed)
Physical Therapy Evaluation Patient Details Name: Jimmy Friedman MRN: [id number] DOB: 1927/10/11 Today's Date: 05/18/2015   History of Present Illness  Jimmy Friedman is a 82 y.o. male with a known history of cva without residual deficit, essential hypertension presenting with memory and speech issues. He has been experiencing memory loss and difficulty speaking for 2 days. He knows what he wants to say but intermittently cannot express this. On arriavl to ED noted to be hypertensive SBP>200. He was evaluated in the ED at onset of symptoms but discharged home with negative work up and resolution of symptoms.   Clinical Impression  Pt demonstrates good transfers and ambulation with therapy. He actually does much better with ambulation without an assistive device. He does have some balance deficits in narrow and single leg stance. Mild lateral deviation with ambulation performing head turns but able to self correct. May beneift from use of single point cane. Significant word finding difficulty during evaluation today. Would benefit from referral for SLP while inpatient or after discharge. Would recommend [facility] PT for safety, balance, and strengthening. Per daughter his strength is not currently at baseline. Pt will benefit from skilled PT services to address deficits in strength, balance, and mobility in order to return to full function at home.      Follow Up Recommendations Home health PT;Supervision - Intermittent (For balance and safety)    Equipment Recommendations  None recommended by PT;Other (comment) (Intermittent use of single point cane)    Recommendations for Other Services       Precautions / Restrictions Precautions Precautions: Fall Restrictions Weight Bearing Restrictions: No      Mobility  Bed Mobility Overal bed mobility: Independent             General bed mobility comments: Good speed/sequencing  Transfers Overall transfer level: Needs assistance Equipment  used: Rolling walker (2 wheeled) Transfers: Sit to/from Stand Sit to Stand: Supervision         General transfer comment: Pt demonstrates good strength and stability with sit to stand. Safe hand placement  Ambulation/Gait Ambulation/Gait assistance: Min guard Ambulation Distance (Feet): 275 Feet Assistive device: None Gait Pattern/deviations: Step-through pattern   Gait velocity interpretation: at or above normal speed for age/gender General Gait Details: Utilized rolling walker for first 52' and then discontinued. Pt ambulates better without rolling walker. Able to perform gait speed changes as well as horizontal and vertical head turns without LOB. Mild lateral deviations in gait but able to self correct. No DOE and vitals WNL  Stairs            Wheelchair Mobility    Modified Rankin (Stroke Patients Only)       Balance Overall balance assessment: Needs assistance Sitting-balance support: No upper extremity supported Sitting balance-Leahy Scale: Good     Standing balance support: No upper extremity supported Standing balance-Leahy Scale: Fair Standing balance comment: Positive Rhomberg, single leg stance <2 seconds on each LE. Pt able to maintain narrow stance without LOB                             Pertinent Vitals/Pain Pain Assessment: No/denies pain    Home Living Family/patient expects to be discharged to:: Private residence Living Arrangements: Alone Available Help at Discharge: Family Type of Home: House Home Access: Stairs to enter Entrance Stairs-Rails: Psychiatric nurse of Steps: 3 Home Layout: One level Home Equipment: Toilet riser;Grab bars - tub/shower (May have walker or  cane but unsur3e)      Prior Function Level of Independence: Independent         Comments: Pt. was Independent with ADls/IADLs , working fulltime, driving ,and Comptroller. (Pt has declined over the last few days/week.)     Hand  Dominance   Dominant Hand: Right    Extremity/Trunk Assessment   Upper Extremity Assessment: Overall WFL for tasks assessed           Lower Extremity Assessment: Overall WFL for tasks assessed (No focal weakness/numbness tingling)         Communication   Communication: Expressive difficulties  Cognition Arousal/Alertness: Awake/alert Behavior During Therapy: WFL for tasks assessed/performed Overall Cognitive Status: Impaired/Different from baseline Area of Impairment: Orientation Orientation Level: Disoriented to;Time;Situation (Able to provide year, wrong on month)                  General Comments      Exercises        Assessment/Plan    PT Assessment Patient needs continued PT services  PT Diagnosis Abnormality of gait   PT Problem List Decreased balance  PT Treatment Interventions DME instruction;Gait training;Stair training;Therapeutic activities;Therapeutic exercise;Balance training;Neuromuscular re-education   PT Goals (Current goals can be found in the Care Plan section) Acute Rehab PT Goals Patient Stated Goal: Return to prior level of independence PT Goal Formulation: With patient/family Time For Goal Achievement: 06/01/15 Potential to Achieve Goals: Fair    Frequency Min 2X/week   Barriers to discharge        Co-evaluation               End of Session Equipment Utilized During Treatment: Gait belt Activity Tolerance: Patient tolerated treatment well Patient left: in bed;with call bell/phone within reach;with bed alarm set;with family/visitor present Nurse Communication: Other (comment) (MD and CM notified of DC recommendations)    Functional Assessment Tool Used: clinical judgement Functional Limitation: Mobility: Walking and moving around Mobility: Walking and Moving Around Current Status [phone]): At least 1 percent but less than 20 percent impaired, limited or restricted Mobility: Walking and Moving Around Goal Status [phone]):  At least 1 percent but less than 20 percent impaired, limited or restricted    Time: 1315-1350 PT Time Calculation (min) (ACUTE ONLY): 35 min   Charges:   PT Evaluation $PT Eval Moderate Complexity: 1 Procedure PT Treatments $Gait Training: 8-22 mins   PT G Codes:   PT G-Codes **NOT FOR INPATIENT CLASS** Functional Assessment Tool Used: clinical judgement Functional Limitation: Mobility: Walking and moving around Mobility: Walking and Moving Around Current Status [id number]): At least 1 percent but less than 20 percent impaired, limited or restricted Mobility: Walking and Moving Around Goal Status [phone]): At least 1 percent but less than 20 percent impaired, limited or restricted   Phillips Grout PT, DPT   Jimmy Friedman 05/18/2015, 2:09 PM

## 2015-05-18 NOTE — Discharge Summary (Addendum)
[facility] [place] NAME: Jimmy Friedman    MR#:  [id number]  DATE OF BIRTH:  1927-07-29  DATE OF ADMISSION:  05/16/2015 ADMITTING PHYSICIAN: Lytle Butte, MD  DATE OF DISCHARGE: 05/18/15 PRIMARY CARE PHYSICIAN: Golden Pop, MD    ADMISSION DIAGNOSIS:  Hypertensive encephalopathy [I67.4] History of CVA (cerebrovascular accident) [Z86.73] Hypertensive urgency [I16.0]  DISCHARGE DIAGNOSIS:  Hypertensive encephalopathy [I67.4] History of CVA (cerebrovascular accident) [Z86.73] Hypertensive urgency [I16.0]  SECONDARY DIAGNOSIS:   Past Medical History  Diagnosis Date  . Coronary artery disease   . MI (myocardial infarction) ([place])   . S/P AVR (aortic valve replacement)   . Prostate cancer ([place])   . Kidney problem   . Hypertension   . Hyperlipidemia   . Chronic prostatitis   . TIA (transient ischemic attack)     HOSPITAL COURSE:  Jimmy Friedman is a 82 y.o. male with a known history of cva without residual deficit, essential hypertension presenting with memory and speech issues. He has been experiencing memory loss and difficulty speaking for 2 days. He knows what he wants to say but intermittently cannot express this. On arriavl to ED noted to be hypertensive SBP>200.  * Accelerated hypertension This is due to patient's cognitive impairment from the recent stroke. Advised a pillbox. Blood pressure improved once he has been restarted on his home medications. Increase losartan from 25 mg to 50 mg. Discussed with case manager and will set up home health.daughter has also arrange sitters at home BP much improved Advised patient not to drive until he follows with neurologist in 1 week at [facility].  * Cognitive impairment due to recent CVA Stable. - Continue aspirin, statin.  * CKD stage III is stable.  * DVT prophylaxis with Lovenox  Hemodynamically stable. D/c home with [facility]  CONSULTS OBTAINED:  Treatment Team:  Lytle Butte, MD  DRUG ALLERGIES:   Allergies  Allergen Reactions  . Penicillins Swelling and Other (See Comments)    Reaction:  Feet swelling  Has patient had a PCN reaction causing immediate rash, facial/tongue/throat swelling, SOB or lightheadedness with hypotension: No Has patient had a PCN reaction causing severe rash involving mucus membranes or skin necrosis: No Has patient had a PCN reaction that required hospitalization No Has patient had a PCN reaction occurring within the last 10 years: No If all of the above answers are "NO", then may proceed with Cephalosporin use.  . Sulfamethoxazole-Trimethoprim Rash    DISCHARGE MEDICATIONS:   Current Discharge Medication List    CONTINUE these medications which have CHANGED   Details  carvedilol (COREG) 12.5 MG tablet Take 1 tablet (12.5 mg total) by mouth 2 (two) times daily. Qty: 60 tablet, Refills: 2    losartan (COZAAR) 50 MG tablet Take 1 tablet (50 mg total) by mouth daily. Qty: 30 tablet, Refills: 0      CONTINUE these medications which have NOT CHANGED   Details  aspirin EC 81 MG tablet Take 81 mg by mouth daily.    Cholecalciferol (VITAMIN D3) 5000 units TABS Take 20,000 Units by mouth 2 (two) times daily.    clotrimazole-betamethasone (LOTRISONE) cream Apply 1 application topically 2 (two) times daily. Qty: 30 g, Refills: 0    doxycycline (VIBRA-TABS) 100 MG tablet Take 100 mg by mouth daily.    fluticasone (FLOVENT HFA) 220 MCG/ACT inhaler Inhale 1 puff into the lungs 2 (two) times daily.     furosemide (LASIX) 20 MG tablet  Take 20 mg by mouth daily as needed for edema.    glucosamine-chondroitin 500-400 MG tablet Take 1 tablet by mouth 2 (two) times daily.    Multiple Minerals (CALCIUM-MAGNESIUM-ZINC) TABS Take 1 tablet by mouth at bedtime.    Multiple Vitamin (MULTIVITAMIN WITH MINERALS) TABS tablet Take 1 tablet by mouth daily.    pravastatin (PRAVACHOL) 40 MG tablet Take 40 mg by mouth at bedtime.     vitamin C (ASCORBIC ACID) 500 MG tablet Take 1,000 mg by mouth at bedtime.     vitamin E 400 UNIT capsule Take 400 Units by mouth at bedtime.        If you experience worsening of your admission symptoms, develop shortness of breath, life threatening emergency, suicidal or homicidal thoughts you must seek medical attention immediately by calling 911 or calling your MD immediately  if symptoms less severe.  You Must read complete instructions/literature along with all the possible adverse reactions/side effects for all the Medicines you take and that have been prescribed to you. Take any new Medicines after you have completely understood and accept all the possible adverse reactions/side effects.   Please note  You were cared for by a hospitalist during your hospital stay. If you have any questions about your discharge medications or the care you received while you were in the hospital after you are discharged, you can call the unit and asked to speak with the hospitalist on call if the hospitalist that took care of you is not available. Once you are discharged, your primary care physician will handle any further medical issues. Please note that NO REFILLS for any discharge medications will be authorized once you are discharged, as it is imperative that you return to your primary care physician (or establish a relationship with a primary care physician if you do not have one) for your aftercare needs so that they can reassess your need for medications and monitor your lab values. Today   SUBJECTIVE   Denies any complaints. Wants to go home  VITAL SIGNS:  Blood pressure 201/82, pulse 66, temperature 97.8 F (36.6 C), temperature source Oral, resp. rate 18, height 5\' 10"  (1.778 m), weight 68.493 kg (151 lb), SpO2 92 %.  I/O:    Intake/Output Summary (Last 24 hours) at 05/18/15 1220 Last data filed at 05/18/15 0930  Gross per 24 hour  Intake    120 ml  Output   1200 ml  Net  -1080 ml     PHYSICAL EXAMINATION:  GENERAL:  82 y.o.-year-old patient lying in the bed with no acute distress.  EYES: Pupils equal, round, reactive to light and accommodation. No scleral icterus. Extraocular muscles intact.  HEENT: Head atraumatic, normocephalic. Oropharynx and nasopharynx clear.  NECK:  Supple, no jugular venous distention. No thyroid enlargement, no tenderness.  LUNGS: Normal breath sounds bilaterally, no wheezing, rales,rhonchi or crepitation. No use of accessory muscles of respiration.  CARDIOVASCULAR: S1, S2 normal. No murmurs, rubs, or gallops.  ABDOMEN: Soft, non-tender, non-distended. Bowel sounds present. No organomegaly or mass.  EXTREMITIES: No pedal edema, cyanosis, or clubbing.  NEUROLOGIC: Cranial nerves II through XII are intact. Muscle strength 5/5 in all extremities. Sensation intact. Gait not checked.  PSYCHIATRIC: The patient is alert and oriented x 3.  SKIN: No obvious rash, lesion, or ulcer.   DATA REVIEW:   CBC   Recent Labs Lab 05/17/15 0436  WBC 7.6  HGB 11.1*  HCT 32.1*  PLT 123*    Chemistries   Recent Labs  Lab 05/16/15 1656 05/17/15 0436  NA 143 143  K 3.4* 3.8  CL 104 108  CO2 31 28  GLUCOSE 145* 126*  BUN 39* 37*  CREATININE 2.12* 2.01*  CALCIUM 11.7* 11.6*  MG 2.0  --   AST 26  --   ALT 19  --   ALKPHOS 44  --   BILITOT 1.7*  --     Microbiology Results   No results found for this or any previous visit (from the past 240 hour(s)).  RADIOLOGY:  Ct Head Wo Contrast  05/16/2015  CLINICAL DATA:  82 year old male with memory difficulty and recent TIA. EXAM: CT HEAD WITHOUT CONTRAST TECHNIQUE: Contiguous axial images were obtained from the base of the skull through the vertex without intravenous contrast. COMPARISON:  05/14/2015 and prior exams FINDINGS: Atrophy and mild chronic small-vessel white matter ischemic changes again noted. No acute intracranial abnormalities are identified, including mass lesion or mass effect,  hydrocephalus, extra-axial fluid collection, midline shift, hemorrhage, or acute infarction. The visualized bony calvarium is unremarkable. IMPRESSION: No evidence of acute intracranial abnormality. Atrophy and chronic small-vessel white matter ischemic changes. Electronically Signed   By: Margarette Canada M.D.   On: 05/16/2015 17:55     Management plans discussed with the patient, family and they are in agreement.  CODE STATUS:     Code Status Orders        Start     Ordered   05/16/15 1954  Full code   Continuous     05/16/15 1953    Code Status History    Date Active Date Inactive Code Status Order ID Comments User Context   04/09/2015  1:38 AM 04/09/2015  8:13 PM Full Code [id number]  Saundra Shelling, MD ED    Advance Directive Documentation        Most Recent Value   Type of Advance Directive  Living will   Pre-existing out of facility DNR order (yellow form or pink MOST form)     "MOST" Form in Place?        TOTAL TIME TAKING CARE OF THIS PATIENT: 40 minutes.    Amerah Puleo M.D on 05/18/2015 at 12:20 PM  Between 7am to 6pm - Pager - [phone] After 6pm go to [email or web address] - password [email or web address] [facility]  Office  [phone]  CC: Primary care physician; Golden Pop, MD

## 2015-05-18 NOTE — Progress Notes (Signed)
Patient ID: Jimmy Friedman, male   DOB: 09/09/1927, 82 y.o.   MRN: [id number] pt was recently admitted at [facility] and of March with a stroke state there for 2 days was cleared to go home with home health who discharged him in couple days at home. Patient was pretty functional at home for daughter. Daughter feels patient has declined and wanted to see if physical therapy see him today. Spoke with PT will see patient today. Spoke with daughter on the phone.

## 2015-05-18 NOTE — Telephone Encounter (Signed)
Received notification via fax from [facility] as they have not received transmissions for the patient for more than 2 days. Informed Raquel Sarna at [facility] that pt currently inpatient at [facility] w/telemetry monitoring. She verbalized understanding and is appreciative of the call.

## 2015-05-18 NOTE — Consult Note (Signed)
   [facility] CM Inpatient Consult   05/18/2015  Jimmy Friedman [birth year] [phone]   Met with the patient at bedside regarding the benefits of [facility] Management services. Explained that [facility] is a covered benefit of medicare insurance. Reviewed information for [facility] Care Management and a pamphlet was provided with contact information.  Explained that [facility] Management does not interfere with or replace any services arranged by the inpatient care management staff.  Patient stated he was not interested in services at this time.  Made patient aware that even if changed his mind after discharged he may call [facility]-office or Hospital Liaison's contact information to sign up with [facility]. For questions please contact:    Alizeh Madril RN, [facility] Liaison  [phone]) Business Mobile [phone]) Toll free office

## 2015-05-18 NOTE — Care Management (Signed)
Jimmy Friedman with [facility] notified of discharge. Primary nurse to call daughter when patient is ready.

## 2015-05-18 NOTE — Progress Notes (Signed)
Patient ID: HARPER KISE, male   DOB: 1927/02/06, 82 y.o.   MRN: [id number] Patient's discharge canceled for tonight.  Pressure bit on the higher side. Discuss with neurology Dr. Doy Mince. EEG has been completed. Results pending. Will increase losartan to 50 mg twice a day and Coreg to 12.5 mg twice a day to see if her pressure gets under better control.

## 2015-05-18 NOTE — Progress Notes (Signed)
Patient ID: Jimmy Friedman, male   DOB: [birth year], 82 y.o.   MRN: [id number] [facility] [place] NAME: Jimmy Friedman    MR#:  [id number]  DATE OF BIRTH:  1927-07-13  SUBJECTIVE:   Some mild confusion per daughter REVIEW OF SYSTEMS:   Review of Systems  Constitutional: Negative for fever, chills and weight loss.  HENT: Negative for ear discharge, ear pain and nosebleeds.   Eyes: Negative for blurred vision, pain and discharge.  Respiratory: Negative for sputum production, shortness of breath, wheezing and stridor.   Cardiovascular: Negative for chest pain, palpitations, orthopnea and PND.  Gastrointestinal: Negative for nausea, vomiting, abdominal pain and diarrhea.  Genitourinary: Negative for urgency and frequency.  Musculoskeletal: Negative for back pain and joint pain.  Neurological: Negative for sensory change, speech change, focal weakness and weakness.  Psychiatric/Behavioral: Negative for depression and hallucinations. The patient is not nervous/anxious.   All other systems reviewed and are negative.  Tolerating Diet:yes Tolerating PT: HHPT  DRUG ALLERGIES:   Allergies  Allergen Reactions  . Penicillins Swelling and Other (See Comments)    Reaction:  Feet swelling  Has patient had a PCN reaction causing immediate rash, facial/tongue/throat swelling, SOB or lightheadedness with hypotension: No Has patient had a PCN reaction causing severe rash involving mucus membranes or skin necrosis: No Has patient had a PCN reaction that required hospitalization No Has patient had a PCN reaction occurring within the last 10 years: No If all of the above answers are "NO", then may proceed with Cephalosporin use.  . Sulfamethoxazole-Trimethoprim Rash    VITALS:  Blood pressure 162/75, pulse 68, temperature 97.8 F (36.6 C), temperature source Oral, resp. rate 18, height 5\' 10"  (1.778 m), weight 68.493 kg (151 lb), SpO2 92  %.  PHYSICAL EXAMINATION:   Physical Exam  GENERAL:  82 y.o.-year-old patient lying in the bed with no acute distress.  EYES: Pupils equal, round, reactive to light and accommodation. No scleral icterus. Extraocular muscles intact.  HEENT: Head atraumatic, normocephalic. Oropharynx and nasopharynx clear.  NECK:  Supple, no jugular venous distention. No thyroid enlargement, no tenderness.  LUNGS: Normal breath sounds bilaterally, no wheezing, rales, rhonchi. No use of accessory muscles of respiration.  CARDIOVASCULAR: S1, S2 normal. No murmurs, rubs, or gallops.  ABDOMEN: Soft, nontender, nondistended. Bowel sounds present. No organomegaly or mass.  EXTREMITIES: No cyanosis, clubbing or edema b/l.    NEUROLOGIC: Cranial nerves II through XII are intact. No focal Motor or sensory deficits b/l.   PSYCHIATRIC:  patient is alert and oriented x 3.  SKIN: No obvious rash, lesion, or ulcer.   LABORATORY PANEL:  CBC  Recent Labs Lab 05/17/15 0436  WBC 7.6  HGB 11.1*  HCT 32.1*  PLT 123*    Chemistries   Recent Labs Lab 05/16/15 1656 05/17/15 0436  NA 143 143  K 3.4* 3.8  CL 104 108  CO2 31 28  GLUCOSE 145* 126*  BUN 39* 37*  CREATININE 2.12* 2.01*  CALCIUM 11.7* 11.6*  MG 2.0  --   AST 26  --   ALT 19  --   ALKPHOS 44  --   BILITOT 1.7*  --    Cardiac Enzymes  Recent Labs Lab 05/16/15 1656  TROPONINI 0.04*   RADIOLOGY:  No results found. ASSESSMENT AND PLAN:   [name] is a 82 y.o. male with a known history of cva without residual deficit, essential hypertension presenting with memory and  speech issues. He has been experiencing memory loss and difficulty speaking for 2 days. He knows what he wants to say but intermittently cannot express this. On arriavl to ED noted to be hypertensive SBP>200.  * Accelerated hypertension This is due to patient's cognitive impairment from the recent stroke. Advised a pillbox. Blood pressure improved once he has been  restarted on his home medications. Increase losartan from 25 mg to 50 mg. Discussed with case manager and will set up home health.daughter has also arrange sitters at home BP much improved Advised patient not to drive until he follows with neurologist in 1 week at [facility].  * Cognitive impairment due to recent CVA Stable. - Continue aspirin, statin.  * CKD stage III is stable.  * DVT prophylaxis with Lovenox  Case discussed with Care Management/Social Worker. Management plans discussed with the patient, family and they are in agreement.  CODE STATUS: full DVT Prophylaxis: lovenox TOTAL TIME TAKING CARE OF THIS PATIENT:30 minutes.  >50% time spent on counselling and coordination of care  POSSIBLE D/C IN 2-3 DAYS, DEPENDING ON CLINICAL CONDITION.  Note: This dictation was prepared with Dragon dictation along with smaller phrase technology. Any transcriptional errors that result from this process are unintentional.  Coron Rossano M.D on 05/18/2015 at 5:49 PM  Between 7am to 6pm - Pager - [phone]  After 6pm go to [email or web address] - password [email or web address] [facility]  Office  [phone]  CC: Primary care physician; Golden Pop, MD

## 2015-05-18 NOTE — Consult Note (Signed)
Reason for Consult:Altered mental status Referring Physician: Posey Pronto  CC: Altered mental status  HPI: Jimmy Friedman is an 82 y.o. male recently admitted 04/10/15 after presenting with aphasia.  Patient received tPA and was transferred to [facility] where stroke work up was completed including an MRI of the brain that showed no acute stroke.  Speech improved and patient returned home.  Patient was seen by daughter last weekend and at that time was doing well.  On Friday called EMS himself.  Was seen in the ED reporting 2 days worth of difficulty with speech.  Patient returned to baseline in the ED.  MRI was unremarkable at that time for an acute infarct and the patient was discharged to home.  Patient returned to the ED two days later reporting that he was confused.  Again with difficulty with speech.  Wife died about 2 weeks ago.    Past Medical History  Diagnosis Date  . Coronary artery disease   . MI (myocardial infarction) ([place])   . S/P AVR (aortic valve replacement)   . Prostate cancer ([place])   . Kidney problem   . Hypertension   . Hyperlipidemia   . Chronic prostatitis   . TIA (transient ischemic attack)     Past Surgical History  Procedure Laterality Date  . Coronary artery bypass graft    . Aortic valve replacement    . Kidney surgery      Family History  Problem Relation Age of Onset  . Heart Problems Mother   . Osteoporosis Mother   . Heart Problems Father   . Hypertension Father   . Heart Problems Son     Social History:  reports that he has quit smoking. He has never used smokeless tobacco. He reports that he drinks alcohol. He reports that he does not use illicit drugs.  Allergies  Allergen Reactions  . Penicillins Swelling and Other (See Comments)    Reaction:  Feet swelling  Has patient had a PCN reaction causing immediate rash, facial/tongue/throat swelling, SOB or lightheadedness with hypotension: No Has patient had a PCN reaction causing severe rash involving  mucus membranes or skin necrosis: No Has patient had a PCN reaction that required hospitalization No Has patient had a PCN reaction occurring within the last 10 years: No If all of the above answers are "NO", then may proceed with Cephalosporin use.  . Sulfamethoxazole-Trimethoprim Rash    Medications:  I have reviewed the patient's current medications. Prior to Admission:  Prescriptions prior to admission  Medication Sig Dispense Refill Last Dose  . aspirin EC 81 MG tablet Take 81 mg by mouth daily.   unknown  . carvedilol (COREG) 6.25 MG tablet Take 1 tablet (6.25 mg total) by mouth 2 (two) times daily. 180 tablet 3 unknown  . Cholecalciferol (VITAMIN D3) 5000 units TABS Take 20,000 Units by mouth 2 (two) times daily.   unknown  . clotrimazole-betamethasone (LOTRISONE) cream Apply 1 application topically 2 (two) times daily. 30 g 0 unknown  . doxycycline (VIBRA-TABS) 100 MG tablet Take 100 mg by mouth daily.   unknown  . fluticasone (FLOVENT HFA) 220 MCG/ACT inhaler Inhale 1 puff into the lungs 2 (two) times daily.    unknown  . furosemide (LASIX) 20 MG tablet Take 20 mg by mouth daily as needed for edema.   unknown  . glucosamine-chondroitin 500-400 MG tablet Take 1 tablet by mouth 2 (two) times daily.   unknown  . Multiple Minerals (CALCIUM-MAGNESIUM-ZINC) TABS Take 1 tablet by  mouth at bedtime.   unknown  . Multiple Vitamin (MULTIVITAMIN WITH MINERALS) TABS tablet Take 1 tablet by mouth daily.   unknown  . pravastatin (PRAVACHOL) 40 MG tablet Take 40 mg by mouth at bedtime.   unknown  . vitamin C (ASCORBIC ACID) 500 MG tablet Take 1,000 mg by mouth at bedtime.    unknown  . vitamin E 400 UNIT capsule Take 400 Units by mouth at bedtime.   unknown  . [DISCONTINUED] losartan (COZAAR) 25 MG tablet Take 1 tablet (25 mg total) by mouth daily. 90 tablet 3 unknown   Scheduled: . aspirin EC  81 mg Oral Daily  . budesonide (PULMICORT) nebulizer solution  0.25 mg Nebulization BID  . calcium  carbonate  1 tablet Oral QHS  . carvedilol  12.5 mg Oral BID  . doxycycline  100 mg Oral Daily  . heparin  5,000 Units Subcutaneous Q8H  . losartan  50 mg Oral BID  . magnesium oxide  400 mg Oral QHS  . multivitamin with minerals  1 tablet Oral Daily  . pravastatin  40 mg Oral QHS  . sodium chloride flush  3 mL Intravenous Q12H  . vitamin C  1,000 mg Oral QHS  . Vitamin D (Ergocalciferol)  50,000 Units Oral Daily  . vitamin E  400 Units Oral QHS  . zinc sulfate  220 mg Oral QHS    ROS: History obtained from the patient  General ROS: negative for - chills, fatigue, fever, night sweats, weight gain or weight loss Psychological ROS: negative for - behavioral disorder, hallucinations, memory difficulties, mood swings or suicidal ideation Ophthalmic ROS: negative for - blurry vision, double vision, eye pain or loss of vision ENT ROS: negative for - epistaxis, nasal discharge, oral lesions, sore throat, tinnitus or vertigo Allergy and Immunology ROS: negative for - hives or itchy/watery eyes Hematological and Lymphatic ROS: negative for - bleeding problems, bruising or swollen lymph nodes Endocrine ROS: negative for - galactorrhea, hair pattern changes, polydipsia/polyuria or temperature intolerance Respiratory ROS: negative for - cough, hemoptysis, shortness of breath or wheezing Cardiovascular ROS: negative for - chest pain, dyspnea on exertion, edema or irregular heartbeat Gastrointestinal ROS: negative for - abdominal pain, diarrhea, hematemesis, nausea/vomiting or stool incontinence Genito-Urinary ROS: negative for - dysuria, hematuria, incontinence or urinary frequency/urgency Musculoskeletal ROS: negative for - joint swelling or muscular weakness Neurological ROS: as noted in HPI Dermatological ROS: negative for rash and skin lesion changes  Physical Examination: Blood pressure 182/72, pulse 73, temperature 97.8 F (36.6 C), temperature source Oral, resp. rate 18, height 5\' 10"   (1.778 m), weight 68.493 kg (151 lb), SpO2 92 %.  HEENT-  Normocephalic, no lesions, without obvious abnormality.  Normal external eye and conjunctiva.  Normal TM's bilaterally.  Normal auditory canals and external ears. Normal external nose, mucus membranes and septum.  Normal pharynx. Cardiovascular- S1, S2 normal, pulses palpable throughout   Lungs- chest clear, no wheezing, rales, normal symmetric air entry Abdomen- soft, non-tender; bowel sounds normal; no masses,  no organomegaly Extremities- no edema Lymph-no adenopathy palpable Musculoskeletal-no joint tenderness, deformity or swelling Skin-warm and dry, no hyperpigmentation, vitiligo, or suspicious lesions  Neurological Examination Mental Status: Alert.  Reports that he is having hallucinations.  Speech nonfluent and unable to get most ideas across.  Will at times get out a full sentence.  Able to follow 3 step commands but requires extensive reinforcement.  . Cranial Nerves: II: Discs flat bilaterally; Visual fields grossly normal, pupils equal, round, reactive to  light and accommodation III,IV, VI: ptosis not present, extra-ocular motions intact bilaterally V,VII: smile symmetric, facial light touch sensation normal bilaterally VIII: hearing normal bilaterally IX,X: gag reflex present XI: bilateral shoulder shrug XII: midline tongue extension Motor: Right : Upper extremity   5/5    Left:     Upper extremity   5/5  Lower extremity   5/5     Lower extremity   5/5 Tone and bulk:normal tone throughout; no atrophy noted Sensory: Pinprick and light touch intact throughout, bilaterally Deep Tendon Reflexes: 2+ and symmetric with absent AJ's bilaterally Plantars: Right: downgoing   Left: downgoing Cerebellar: normal finger-to-nose, normal rapid alternating movements and normal heel-to-shin test Gait: not tested due to safety concerns   Laboratory Studies:   Basic Metabolic Panel:  Recent Labs Lab 05/14/15 1458  05/16/15 1656 05/17/15 0436  NA 142 143 143  K 4.2 3.4* 3.8  CL 102 104 108  CO2 31 31 28   GLUCOSE 101* 145* 126*  BUN 41* 39* 37*  CREATININE 2.21* 2.12* 2.01*  CALCIUM 12.3* 11.7* 11.6*  MG  --  2.0  --     Liver Function Tests:  Recent Labs Lab 05/14/15 1458 05/16/15 1656  AST 27 26  ALT 17 19  ALKPHOS 39 44  BILITOT 1.4* 1.7*  PROT 7.0 7.0  ALBUMIN 4.0 4.1   No results for input(s): LIPASE, AMYLASE in the last 168 hours. No results for input(s): AMMONIA in the last 168 hours.  CBC:  Recent Labs Lab 05/14/15 1458 05/16/15 1656 05/17/15 0436  WBC 7.9 8.0 7.6  NEUTROABS 5.8 5.8  --   HGB 11.8* 11.8* 11.1*  HCT 35.1* 36.1* 32.1*  MCV 95.2 97.0 94.9  PLT 131* 128* 123*    Cardiac Enzymes:  Recent Labs Lab 05/14/15 1458 05/14/15 1759 05/16/15 1656  TROPONINI 0.04* 0.04* 0.04*    BNP: Invalid input(s): POCBNP  CBG: No results for input(s): GLUCAP in the last 168 hours.  Microbiology: Results for orders placed or performed in visit on 01/12/15  Microscopic Examination     Status: Abnormal   Collection Time: 01/12/15  1:42 PM  Result Value Ref Range Status   WBC, UA None seen 0 -  5 /hpf Final   RBC, UA None seen 0 -  2 /hpf Final   Epithelial Cells (non renal) 0-10 0 - 10 /hpf Final   Renal Epithel, UA 0-10 (A) None seen /hpf Final   Casts Present None seen /lpf Final   Cast Type Granular casts N/A Final    Coagulation Studies: No results for input(s): LABPROT, INR in the last 72 hours.  Urinalysis:  Recent Labs Lab 05/14/15 1605 05/16/15 1656  COLORURINE YELLOW* YELLOW*  LABSPEC 1.011 1.013  PHURINE 7.0 6.0  GLUCOSEU NEGATIVE NEGATIVE  HGBUR NEGATIVE NEGATIVE  BILIRUBINUR NEGATIVE NEGATIVE  KETONESUR NEGATIVE NEGATIVE  PROTEINUR NEGATIVE NEGATIVE  NITRITE NEGATIVE NEGATIVE  LEUKOCYTESUR NEGATIVE NEGATIVE    Lipid Panel:     Component Value Date/Time   CHOL 139 04/09/2015 0413   CHOL 121 03/02/2015 0838   TRIG 62  04/09/2015 0413   HDL 41 04/09/2015 0413   HDL 39* 03/02/2015 0838   CHOLHDL 3.4 04/09/2015 0413   VLDL 12 04/09/2015 0413   LDLCALC 86 04/09/2015 0413   LDLCALC 61 03/02/2015 0838    HgbA1C: No results found for: HGBA1C  Urine Drug Screen:  No results found for: LABOPIA, COCAINSCRNUR, LABBENZ, AMPHETMU, THCU, LABBARB  Alcohol Level: No results for input(s): [facility]  in the last 168 hours.  [id number] Imaging: No results found.   Assessment/Plan: 82 year old male with altered mental status.  Etiology unclear.  Refuses repeat MRI at this time.  BP has been elevated and can not rule out the possibility of a PRES or hypertensive encephalopathy.  MRI of the brain personally reviewed and does show a chronic small left parietal infarct.  Therefore can not rule out seizure.  Patient has not been febrile but will rule out the possibility of medications side effects as well.  Patient on ASA.    1.  EEG 2.  Review of medications 3.  BP control with target SBP <160  Alexis Goodell, MD Neurology [phone] 05/18/2015, 7:44 PM

## 2015-05-19 DIAGNOSIS — Z7982 Long term (current) use of aspirin: Secondary | ICD-10-CM | POA: Diagnosis not present

## 2015-05-19 DIAGNOSIS — I252 Old myocardial infarction: Secondary | ICD-10-CM | POA: Diagnosis not present

## 2015-05-19 DIAGNOSIS — Z880 Allergy status to penicillin: Secondary | ICD-10-CM | POA: Diagnosis not present

## 2015-05-19 DIAGNOSIS — N179 Acute kidney failure, unspecified: Secondary | ICD-10-CM | POA: Diagnosis present

## 2015-05-19 DIAGNOSIS — I674 Hypertensive encephalopathy: Secondary | ICD-10-CM | POA: Diagnosis present

## 2015-05-19 DIAGNOSIS — Z8249 Family history of ischemic heart disease and other diseases of the circulatory system: Secondary | ICD-10-CM | POA: Diagnosis not present

## 2015-05-19 DIAGNOSIS — Z882 Allergy status to sulfonamides status: Secondary | ICD-10-CM | POA: Diagnosis not present

## 2015-05-19 DIAGNOSIS — G253 Myoclonus: Secondary | ICD-10-CM | POA: Diagnosis not present

## 2015-05-19 DIAGNOSIS — Z515 Encounter for palliative care: Secondary | ICD-10-CM | POA: Diagnosis present

## 2015-05-19 DIAGNOSIS — Z8262 Family history of osteoporosis: Secondary | ICD-10-CM | POA: Diagnosis not present

## 2015-05-19 DIAGNOSIS — D649 Anemia, unspecified: Secondary | ICD-10-CM | POA: Diagnosis present

## 2015-05-19 DIAGNOSIS — Z87891 Personal history of nicotine dependence: Secondary | ICD-10-CM | POA: Diagnosis not present

## 2015-05-19 DIAGNOSIS — N183 Chronic kidney disease, stage 3 (moderate): Secondary | ICD-10-CM | POA: Diagnosis present

## 2015-05-19 DIAGNOSIS — I129 Hypertensive chronic kidney disease with stage 1 through stage 4 chronic kidney disease, or unspecified chronic kidney disease: Secondary | ICD-10-CM | POA: Diagnosis present

## 2015-05-19 DIAGNOSIS — Z8546 Personal history of malignant neoplasm of prostate: Secondary | ICD-10-CM | POA: Diagnosis not present

## 2015-05-19 DIAGNOSIS — Z8673 Personal history of transient ischemic attack (TIA), and cerebral infarction without residual deficits: Secondary | ICD-10-CM | POA: Diagnosis not present

## 2015-05-19 DIAGNOSIS — Z952 Presence of prosthetic heart valve: Secondary | ICD-10-CM | POA: Diagnosis not present

## 2015-05-19 DIAGNOSIS — F0390 Unspecified dementia without behavioral disturbance: Secondary | ICD-10-CM | POA: Diagnosis present

## 2015-05-19 DIAGNOSIS — R4182 Altered mental status, unspecified: Secondary | ICD-10-CM | POA: Diagnosis not present

## 2015-05-19 DIAGNOSIS — Z66 Do not resuscitate: Secondary | ICD-10-CM | POA: Diagnosis present

## 2015-05-19 DIAGNOSIS — Z951 Presence of aortocoronary bypass graft: Secondary | ICD-10-CM | POA: Diagnosis not present

## 2015-05-19 DIAGNOSIS — E860 Dehydration: Secondary | ICD-10-CM | POA: Diagnosis present

## 2015-05-19 DIAGNOSIS — I2510 Atherosclerotic heart disease of native coronary artery without angina pectoris: Secondary | ICD-10-CM | POA: Diagnosis present

## 2015-05-19 DIAGNOSIS — E785 Hyperlipidemia, unspecified: Secondary | ICD-10-CM | POA: Diagnosis present

## 2015-05-19 DIAGNOSIS — I160 Hypertensive urgency: Secondary | ICD-10-CM | POA: Diagnosis present

## 2015-05-19 LAB — AMMONIA: AMMONIA: 12 umol/L (ref 9–35)

## 2015-05-19 LAB — TSH: TSH: 1.802 u[IU]/mL (ref 0.350–4.500)

## 2015-05-19 MED ORDER — HYDRALAZINE HCL 20 MG/ML IJ SOLN
10.0000 mg | INTRAMUSCULAR | Status: DC | PRN
  Administered 2015-05-19 – 2015-05-20 (×2): 10 mg via INTRAVENOUS
  Filled 2015-05-19 (×2): qty 1

## 2015-05-19 MED ORDER — LEVETIRACETAM 500 MG PO TABS: 500.0000 mg | ORAL_TABLET | Freq: Two times a day (BID) | ORAL | Status: DC

## 2015-05-19 MED ORDER — SODIUM CHLORIDE 0.9 % IV SOLN
INTRAVENOUS | Status: AC
  Administered 2015-05-19: 10:00:00 via INTRAVENOUS

## 2015-05-19 MED ORDER — AMLODIPINE BESYLATE 5 MG PO TABS
5.0000 mg | ORAL_TABLET | Freq: Every day | ORAL | Status: DC
  Administered 2015-05-19: 5 mg via ORAL
  Filled 2015-05-19: qty 1

## 2015-05-19 MED ORDER — CARVEDILOL 12.5 MG PO TABS
25.0000 mg | ORAL_TABLET | Freq: Two times a day (BID) | ORAL | Status: DC
  Administered 2015-05-19: 25 mg via ORAL
  Filled 2015-05-19: qty 2

## 2015-05-19 MED ORDER — LEVETIRACETAM 500 MG/5ML IV SOLN
1000.0000 mg | Freq: Once | INTRAVENOUS | Status: AC
  Administered 2015-05-19: 1000 mg via INTRAVENOUS
  Filled 2015-05-19: qty 10

## 2015-05-19 NOTE — Progress Notes (Addendum)
Notified Dr. Fritzi Mandes of BP of 177/69. Coreg increased to 25mg  BID. Coreg and losaartan given early.

## 2015-05-19 NOTE — Progress Notes (Signed)
Subjective: Patient awake and alert but continues to have difficulty getting his words out.  Also with extremity jerking today.    Objective: Current vital signs: BP 163/65 mmHg  Pulse 62  Temp(Src) 97.9 F (36.6 C) (Oral)  Resp 15  Ht 5\' 10"  (1.778 m)  Wt 68.493 kg (151 lb)  BMI 21.67 kg/m2  SpO2 95% Vital signs in last 24 hours: Temp:  [97.8 F (36.6 C)-97.9 F (36.6 C)] 97.9 F (36.6 C) (05/10 0520) Pulse Rate:  [62-82] 62 (05/10 0858) Resp:  [15-18] 15 (05/10 0520) BP: (152-201)/(65-82) 163/65 mmHg (05/10 0858) SpO2:  [92 %-97 %] 95 % (05/10 0858)  Intake/Output from previous day: 05/09 0701 - 05/10 0700 In: 120 [P.O.:120] Out: 400 [Urine:400] Intake/Output this shift: Total I/O In: 120 [P.O.:120] Out: -  Nutritional status: Diet Heart Room service appropriate?: Yes; Fluid consistency:: Thin  Neurologic Exam: Mental Status: Alert. Unable to follow commands.  Speech aphasic for the most part.  Able to get some words out at times.   Cranial Nerves: II: Discs flat bilaterally; Visual fields grossly normal, pupils equal, round, reactive to light and accommodation III,IV, VI: ptosis not present, extra-ocular motions intact bilaterally V,VII: smile symmetric, facial light touch sensation normal bilaterally VIII: hearing normal bilaterally IX,X: gag reflex present XI: bilateral shoulder shrug XII: midline tongue extension Motor: Right :Upper extremity 5/5Left: Upper extremity 5/5 Lower extremity 5/5Lower extremity 5/5 Myoclonus noted.   Sensory: Pinprick and light touch intact throughout, bilaterally Cerebellar: Unable to perform due to difficulty following commands.    Lab Results: Basic Metabolic Panel:  Recent Labs Lab 05/14/15 1458 05/16/15 1656 05/17/15 0436  NA 142 143 143  K 4.2 3.4* 3.8  CL 102 104 108  CO2 31 31 28   GLUCOSE 101* 145*  126*  BUN 41* 39* 37*  CREATININE 2.21* 2.12* 2.01*  CALCIUM 12.3* 11.7* 11.6*  MG  --  2.0  --     Liver Function Tests:  Recent Labs Lab 05/14/15 1458 05/16/15 1656  AST 27 26  ALT 17 19  ALKPHOS 39 44  BILITOT 1.4* 1.7*  PROT 7.0 7.0  ALBUMIN 4.0 4.1   No results for input(s): LIPASE, AMYLASE in the last 168 hours. No results for input(s): AMMONIA in the last 168 hours.  CBC:  Recent Labs Lab 05/14/15 1458 05/16/15 1656 05/17/15 0436  WBC 7.9 8.0 7.6  NEUTROABS 5.8 5.8  --   HGB 11.8* 11.8* 11.1*  HCT 35.1* 36.1* 32.1*  MCV 95.2 97.0 94.9  PLT 131* 128* 123*    Cardiac Enzymes:  Recent Labs Lab 05/14/15 1458 05/14/15 1759 05/16/15 1656  TROPONINI 0.04* 0.04* 0.04*    Lipid Panel: No results for input(s): CHOL, TRIG, HDL, CHOLHDL, VLDL, LDLCALC in the last 168 hours.  CBG: No results for input(s): GLUCAP in the last 168 hours.  Microbiology: Results for orders placed or performed in visit on 01/12/15  Microscopic Examination     Status: Abnormal   Collection Time: 01/12/15  1:42 PM  Result Value Ref Range Status   WBC, UA None seen 0 -  5 /hpf Final   RBC, UA None seen 0 -  2 /hpf Final   Epithelial Cells (non renal) 0-10 0 - 10 /hpf Final   Renal Epithel, UA 0-10 (A) None seen /hpf Final   Casts Present None seen /lpf Final   Cast Type Granular casts N/A Final    Coagulation Studies: No results for input(s): LABPROT, INR in the last  72 hours.  Imaging: No results found.  Medications:  I have reviewed the patient's current medications. Scheduled: . amLODipine  5 mg Oral Daily  . aspirin EC  81 mg Oral Daily  . budesonide (PULMICORT) nebulizer solution  0.25 mg Nebulization BID  . calcium carbonate  1 tablet Oral QHS  . carvedilol  25 mg Oral BID WC  . doxycycline  100 mg Oral Daily  . heparin  5,000 Units Subcutaneous Q8H  . levETIRAcetam  1,000 mg Intravenous Once  . levETIRAcetam  500 mg Oral BID  . losartan  50 mg Oral BID  .  magnesium oxide  400 mg Oral QHS  . multivitamin with minerals  1 tablet Oral Daily  . sodium chloride flush  3 mL Intravenous Q12H  . vitamin C  1,000 mg Oral QHS  . Vitamin D (Ergocalciferol)  50,000 Units Oral Daily  . vitamin E  400 Units Oral QHS  . zinc sulfate  220 mg Oral QHS    Assessment/Plan: Patient now with myoclonus.  EEG provided no useful information due to artifact.  The only medication of concern is the statin but patient has been on for some time per daughter.    Recommendations: 1.  Will give patient a trial of Keppra 1000mg  IV now with maintenance of 500mg  BID 2.  D/C statin 3.  Serum ammonia and TSH 4.  Strict BP control.  If no improvement will consider LP.  Would change from sq heparin to SCD's.     LOS: 0 days   Alexis Goodell, MD Neurology [phone] 05/19/2015  10:29 AM

## 2015-05-19 NOTE — Progress Notes (Signed)
Daughter visiting at bedside. Dr. Posey Pronto rounding now. Patient sleepy, expressive aphasia, confusion/memory impairment and jerking/twitching movements of both arms, keeping eyes shut. This is worse than yesterday per family. Dr. Posey Pronto contacting Dr. Doy Mince.

## 2015-05-19 NOTE — Progress Notes (Signed)
Walked in to find patient sitting up in bed, eyes closed, attempting to eat while daughter was feeding him - took lots of encouragement to get him to swallow. Encouraged her to hold off on PO stimulation for now while he is so tired/confused. Patient not using as many words as earlier this morning, more like just grunting. Started reaching out in front of him, grabbing the air. Became restless, squirming in bed. He was wet, so we changed him and seems to be resting a little better now. Dr. Posey Pronto was on the floor and came to see him, encouraged low stimulation - lights and TV off. BP did go up to the 170's - MD instructed to go ahead and give the IV hydralazine. Will continue to monitor BP closely. Patient currently resting quietly with daughter at bedside.

## 2015-05-19 NOTE — Progress Notes (Signed)
CCMD called to inform this RN of 1.7 second pause. Patient is running SR/SB 55-60's on tele, asymptomatic, sleeping quietly. Dr. Posey Pronto notified. No orders at this time. Will continue to monitor.

## 2015-05-19 NOTE — Progress Notes (Signed)
Patient ID: Jimmy Friedman, male   DOB: 1927-08-20, 82 y.o.   MRN: [id number] [place] [facility] [place] NAME: Jimmy Friedman    MR#:  [id number]  DATE OF BIRTH:  11/28/1927  SUBJECTIVE:   Patient appears more worse today. He is very confused. He is going to keep movements in both his upper and lower extremities. Unable to finish a sentence. Taking a long time gets fixated to one idea. Keeps his eyes closed. Yelling for help Daughter in the room , tearful.  REVIEW OF SYSTEMS:   Review of Systems  Constitutional: Negative for fever, chills and weight loss.  HENT: Negative for ear discharge, ear pain and nosebleeds.   Eyes: Negative for blurred vision, pain and discharge.  Respiratory: Negative for sputum production, shortness of breath, wheezing and stridor.   Cardiovascular: Negative for chest pain, palpitations, orthopnea and PND.  Gastrointestinal: Negative for nausea, vomiting, abdominal pain and diarrhea.  Genitourinary: Negative for urgency and frequency.  Musculoskeletal: Negative for back pain and joint pain.  Neurological: Negative for sensory change, speech change, focal weakness and weakness.  Psychiatric/Behavioral: Negative for depression and hallucinations. The patient is not nervous/anxious.   All other systems reviewed and are negative.  Tolerating Diet:No  DRUG ALLERGIES:   Allergies  Allergen Reactions  . Penicillins Swelling and Other (See Comments)    Reaction:  Feet swelling  Has patient had a PCN reaction causing immediate rash, facial/tongue/throat swelling, SOB or lightheadedness with hypotension: No Has patient had a PCN reaction causing severe rash involving mucus membranes or skin necrosis: No Has patient had a PCN reaction that required hospitalization No Has patient had a PCN reaction occurring within the last 10 years: No If all of the above answers are "NO", then may proceed with Cephalosporin use.  .  Sulfamethoxazole-Trimethoprim Rash    VITALS:  Blood pressure 163/65, pulse 62, temperature 97.9 F (36.6 C), temperature source Oral, resp. rate 15, height 5\' 10"  (1.778 m), weight 68.493 kg (151 lb), SpO2 95 %.  PHYSICAL EXAMINATION:   Physical Exam  GENERAL:  82 y.o.-year-old patient lying in the bed with no acute distress.  EYES: Pupils equal, round, reactive to light and accommodation. No scleral icterus. Extraocular muscles intact.  HEENT: Head atraumatic, normocephalic. Oropharynx and nasopharynx clear. Dry oral mucosa  NECK:  Supple, no jugular venous distention. No thyroid enlargement, no tenderness.  LUNGS: Normal breath sounds bilaterally, no wheezing, rales, rhonchi. No use of accessory muscles of respiration.  CARDIOVASCULAR: S1, S2 normal. No murmurs, rubs, or gallops.  ABDOMEN: Soft, nontender, nondistended. Bowel sounds present. No organomegaly or mass.  EXTREMITIES: No cyanosis, clubbing or edema b/l.    NEUROLOGIC:Grossly intact. Patient moves all extremity as well. I noted today is bilateral jerking movements in both upper and lower extremity. Patient has good handgrip bilaterally. No facial droop. Psychiatry :  patient is confused Skin : No obvious rash, lesion, or ulcer.   LABORATORY PANEL:  CBC  Recent Labs Lab 05/17/15 0436  WBC 7.6  HGB 11.1*  HCT 32.1*  PLT 123*    Chemistries   Recent Labs Lab 05/16/15 1656 05/17/15 0436  NA 143 143  K 3.4* 3.8  CL 104 108  CO2 31 28  GLUCOSE 145* 126*  BUN 39* 37*  CREATININE 2.12* 2.01*  CALCIUM 11.7* 11.6*  MG 2.0  --   AST 26  --   ALT 19  --   ALKPHOS 44  --  BILITOT 1.7*  --    Cardiac Enzymes  Recent Labs Lab 05/16/15 1656  TROPONINI 0.04*   RADIOLOGY:  No results found. ASSESSMENT AND PLAN:   Jimmy Friedman is a 82 y.o. male with a known history of cva without residual deficit, essential hypertension presenting with memory and speech issues. He has been experiencing memory loss  and difficulty speaking for 2 days. He knows what he wants to say but intermittently cannot express this. On arriavl to ED noted to be hypertensive SBP>200.   * Accelerated hypertension/with hypertensive encephalopathy /possible seizure silent This is due to patient's cognitive impairment from the recent stroke.  -Patient's mentation has deteriorated more since yesterday. he is currently having bilateral upper extremity and lower extremity jerks. He is unable to complete a sentence, disoriented to time place -Not able to keep eyes open and maintain contact -Blood pressure . Increase losartan from 25 mg to 50 mg, Coreg increased to 25 g twice a day, added Norvasc 5 mg daily. IV -when necessary hydralazine 10 mg every 8 hourly  -Patient's MRI on 05/14/2015 did from not show any acute hemorrhage. He has ordered parietal infarct -Likely given deterioration possibility of embolic stroke -I have asked Dr. Doy Mince to see patient this morning -EEG done yesterday not good quality  * Cognitive impairment due to recent CVA -Worsening cognitive decline - Continue aspirin, statin.  * CKD stage III is stable. -Appears clinically dry. We'll start IV fluids.  * DVT prophylaxis with Lovenox  Patient overall has declined clinically. This was discussed with patient's daughter. Repeat MRI is suggested however I'll wait at this time for neurology to come by and reassess patient. -Daughter in the room and agrees with plan.  Case discussed with Care Management/Social Worker. Management plans discussed with the patient, family and they are in agreement.  CODE STATUS: full DVT Prophylaxis: lovenox TOTAL Critical TIME TAKING CARE OF THIS PATIENT:30 minutes.  >50% time spent on counselling and coordination of care with daughter and neurology   D/C  DEPENDING ON CLINICAL CONDITION.  Note: This dictation was prepared with Dragon dictation along with smaller phrase technology. Any transcriptional errors that  result from this process are unintentional.  Aubreigh Fuerte M.D on 05/19/2015 at 9:22 AM  Between 7am to 6pm - Pager - [phone]  After 6pm go to [email or web address] - password [email or web address]  Office  [phone]  CC: Primary care physician; Golden Pop, MD

## 2015-05-19 NOTE — Progress Notes (Signed)
Patient ID: Jimmy Friedman, male   DOB: 05/05/1927, 82 y.o.   MRN: [id number] Seen by Dr Doy Mince. Started on IV keppra for myoclonic jerks Increase bp oral meds for better control. If continues to remain elevated will transfer to CCU for IV BP meds Check BP every 2 hourly. RN informed

## 2015-05-19 NOTE — Care Management (Signed)
Discharge delayed due to HTN. Neurology consulted. Following progression.

## 2015-05-19 NOTE — Progress Notes (Signed)
Per Dr. Posey Pronto, we need to monitor BP more frequently and see how he does with taking PO meds. PRN IV hydralazine parameters have been adjusted by MD.

## 2015-05-20 ENCOUNTER — Inpatient Hospital Stay: Payer: Medicare Other

## 2015-05-20 DIAGNOSIS — I672 Cerebral atherosclerosis: Secondary | ICD-10-CM

## 2015-05-20 DIAGNOSIS — I6529 Occlusion and stenosis of unspecified carotid artery: Secondary | ICD-10-CM

## 2015-05-20 DIAGNOSIS — Z8673 Personal history of transient ischemic attack (TIA), and cerebral infarction without residual deficits: Secondary | ICD-10-CM

## 2015-05-20 DIAGNOSIS — Z951 Presence of aortocoronary bypass graft: Secondary | ICD-10-CM

## 2015-05-20 DIAGNOSIS — N179 Acute kidney failure, unspecified: Secondary | ICD-10-CM

## 2015-05-20 DIAGNOSIS — I252 Old myocardial infarction: Secondary | ICD-10-CM

## 2015-05-20 DIAGNOSIS — F0390 Unspecified dementia without behavioral disturbance: Secondary | ICD-10-CM

## 2015-05-20 DIAGNOSIS — Z87891 Personal history of nicotine dependence: Secondary | ICD-10-CM

## 2015-05-20 DIAGNOSIS — Z515 Encounter for palliative care: Secondary | ICD-10-CM

## 2015-05-20 DIAGNOSIS — I674 Hypertensive encephalopathy: Principal | ICD-10-CM

## 2015-05-20 DIAGNOSIS — Z79899 Other long term (current) drug therapy: Secondary | ICD-10-CM

## 2015-05-20 DIAGNOSIS — D696 Thrombocytopenia, unspecified: Secondary | ICD-10-CM

## 2015-05-20 DIAGNOSIS — R785 Finding of other psychotropic drug in blood: Secondary | ICD-10-CM

## 2015-05-20 DIAGNOSIS — D649 Anemia, unspecified: Secondary | ICD-10-CM

## 2015-05-20 DIAGNOSIS — R52 Pain, unspecified: Secondary | ICD-10-CM

## 2015-05-20 DIAGNOSIS — E860 Dehydration: Secondary | ICD-10-CM

## 2015-05-20 LAB — CBC
HEMATOCRIT: 34 % — AB (ref 40.0–52.0)
Hemoglobin: 11.4 g/dL — ABNORMAL LOW (ref 13.0–18.0)
MCH: 31.9 pg (ref 26.0–34.0)
MCHC: 33.5 g/dL (ref 32.0–36.0)
MCV: 95.2 fL (ref 80.0–100.0)
Platelets: 121 10*3/uL — ABNORMAL LOW (ref 150–440)
RBC: 3.57 MIL/uL — ABNORMAL LOW (ref 4.40–5.90)
RDW: 13.7 % (ref 11.5–14.5)
WBC: 8.6 10*3/uL (ref 3.8–10.6)

## 2015-05-20 MED ORDER — LORAZEPAM 0.5 MG PO TABS: 0.5000 mg | ORAL_TABLET | ORAL | Status: AC | PRN

## 2015-05-20 MED ORDER — MORPHINE SULFATE (PF) 2 MG/ML IV SOLN: 2.0000 mg | INTRAVENOUS | Status: DC | PRN

## 2015-05-20 MED ORDER — MORPHINE SULFATE (CONCENTRATE) 10 MG/0.5ML PO SOLN
5.0000 mg | Freq: Four times a day (QID) | ORAL | Status: DC
  Administered 2015-05-20 – 2015-05-21 (×4): 5 mg via ORAL
  Filled 2015-05-20 (×4): qty 1

## 2015-05-20 MED ORDER — VALPROATE SODIUM 500 MG/5ML IV SOLN
1000.0000 mg | Freq: Once | INTRAVENOUS | Status: AC
  Administered 2015-05-20: 1000 mg via INTRAVENOUS
  Filled 2015-05-20 (×3): qty 10

## 2015-05-20 MED ORDER — LORAZEPAM 2 MG/ML IJ SOLN
1.0000 mg | INTRAMUSCULAR | Status: DC | PRN
  Administered 2015-05-20: 1 mg via INTRAVENOUS
  Filled 2015-05-20: qty 1

## 2015-05-20 MED ORDER — MORPHINE SULFATE (CONCENTRATE) 10 MG/0.5ML PO SOLN: 5.0000 mg | ORAL | Status: DC | PRN

## 2015-05-20 MED ORDER — PROCHLORPERAZINE 25 MG RE SUPP
25.0000 mg | Freq: Three times a day (TID) | RECTAL | Status: DC | PRN
  Filled 2015-05-20: qty 1

## 2015-05-20 MED ORDER — MORPHINE SULFATE (CONCENTRATE) 10 MG/0.5ML PO SOLN: 5.0000 mg | ORAL | Status: AC | PRN

## 2015-05-20 MED ORDER — NITROGLYCERIN 2 % TD OINT
0.5000 [in_us] | TOPICAL_OINTMENT | Freq: Four times a day (QID) | TRANSDERMAL | Status: DC
  Administered 2015-05-20 (×3): 0.5 [in_us] via TOPICAL
  Filled 2015-05-20: qty 30

## 2015-05-20 MED ORDER — MORPHINE SULFATE (CONCENTRATE) 10 MG/0.5ML PO SOLN: 5.0000 mg | Freq: Four times a day (QID) | ORAL | Status: AC

## 2015-05-20 MED ORDER — LORAZEPAM 2 MG/ML IJ SOLN
1.0000 mg | INTRAMUSCULAR | Status: DC | PRN
  Administered 2015-05-21 (×2): 1 mg via INTRAVENOUS
  Filled 2015-05-20 (×2): qty 1

## 2015-05-20 MED ORDER — BISACODYL 10 MG RE SUPP: 10.0000 mg | Freq: Every day | RECTAL | Status: AC | PRN

## 2015-05-20 MED ORDER — HYDRALAZINE HCL 50 MG PO TABS: 50.0000 mg | ORAL_TABLET | Freq: Three times a day (TID) | ORAL | Status: DC

## 2015-05-20 MED ORDER — SODIUM CHLORIDE 0.9 % IV SOLN
INTRAVENOUS | Status: AC
  Administered 2015-05-20: 19:00:00 via INTRAVENOUS

## 2015-05-20 MED ORDER — PROCHLORPERAZINE 25 MG RE SUPP: 25.0000 mg | Freq: Three times a day (TID) | RECTAL | Status: AC | PRN

## 2015-05-20 MED ORDER — SODIUM CHLORIDE 0.9 % IV SOLN
500.0000 mg | Freq: Two times a day (BID) | INTRAVENOUS | Status: DC
  Filled 2015-05-20 (×2): qty 5

## 2015-05-20 MED ORDER — BISACODYL 10 MG RE SUPP: 10.0000 mg | Freq: Every day | RECTAL | Status: DC | PRN

## 2015-05-20 MED ORDER — LORAZEPAM 2 MG/ML IJ SOLN
1.0000 mg | Freq: Once | INTRAMUSCULAR | Status: AC
  Administered 2015-05-20: 1 mg via INTRAVENOUS
  Filled 2015-05-20: qty 1

## 2015-05-20 MED ORDER — MORPHINE SULFATE (PF) 2 MG/ML IV SOLN
1.0000 mg | INTRAVENOUS | Status: DC | PRN
  Administered 2015-05-20 (×2): 1 mg via INTRAVENOUS
  Filled 2015-05-20 (×2): qty 1

## 2015-05-20 MED ORDER — DEXTROSE 5 % IV SOLN
500.0000 mg | Freq: Two times a day (BID) | INTRAVENOUS | Status: DC
  Filled 2015-05-20 (×2): qty 5

## 2015-05-20 MED ORDER — VALPROATE SODIUM 500 MG/5ML IV SOLN
1000.0000 mg | Freq: Once | INTRAVENOUS | Status: DC
  Filled 2015-05-20: qty 10

## 2015-05-20 MED ORDER — VALPROATE SODIUM 500 MG/5ML IV SOLN
500.0000 mg | Freq: Two times a day (BID) | INTRAVENOUS | Status: DC
  Filled 2015-05-20: qty 5

## 2015-05-20 MED ORDER — AMLODIPINE BESYLATE 10 MG PO TABS: 10.0000 mg | ORAL_TABLET | Freq: Every day | ORAL | Status: DC

## 2015-05-20 MED ORDER — HYDRALAZINE HCL 20 MG/ML IJ SOLN
20.0000 mg | INTRAMUSCULAR | Status: DC
  Administered 2015-05-20 (×2): 20 mg via INTRAVENOUS
  Filled 2015-05-20 (×2): qty 1

## 2015-05-20 NOTE — Progress Notes (Signed)
Bedside report received, patient resting in the bed at this time, mood calm, no distress noted.

## 2015-05-20 NOTE — Progress Notes (Signed)
Paged prime doctor again. No answer.

## 2015-05-20 NOTE — Progress Notes (Signed)
Paged prime doctor again with no answer. Patient is resting quietly with no s/s distress. Will continue to monitor.

## 2015-05-20 NOTE — Progress Notes (Signed)
PT Hold Note  Patient Details Name: Jimmy Friedman MRN: [id number] DOB: Apr 08, 1927   Cancelled Treatment:    Reason Eval/Treat Not Completed: Medical issues which prohibited therapy. Chart reviewed. Pt with rapid decline in mentation. Pt also intermittently agitated. Changed to comfort care and awaiting palliative care consult. Will hold pt currently for physical therapy as he is not appropriate at this time. Will attempt PT treatment when/if he becomes appropriate.  [name] [name] PT, DPT   [name] 05/20/2015, 3:35 PM

## 2015-05-20 NOTE — Progress Notes (Addendum)
Palliative Care Update  Family has opted to request pt be full comfort care and that he go to [facility] here in [place] on [address] (where his wife died 3 wks ago).  Social Work consult requested for Hospice Home consideration.  Full note to follow.  I spent one hour tonight in conference with family.  I have done the DNR form and also the Discharge Meds as needed for a transfer to [place].    Colleen Can, MD

## 2015-05-20 NOTE — Progress Notes (Addendum)
Subjective: Patient not taking much po.  Became agitated overnight and was given Morphine.  This morning calm but not particularly interactive.    Objective: Current vital signs: BP 174/77 mmHg  Pulse 62  Temp(Src) 97.6 F (36.4 C) (Oral)  Resp 24  Ht 5\' 10"  (1.778 m)  Wt 68.493 kg (151 lb)  BMI 21.67 kg/m2  SpO2 95% Vital signs in last 24 hours: Temp:  [97.6 F (36.4 C)-98.5 F (36.9 C)] 97.6 F (36.4 C) (05/11 1024) Pulse Rate:  [54-81] 62 (05/11 1024) Resp:  [17-24] 24 (05/11 1024) BP: (116-178)/(48-98) 174/77 mmHg (05/11 1024) SpO2:  [92 %-97 %] 95 % (05/11 1024)  Intake/Output from previous day: 05/10 0701 - 05/11 0700 In: 1601.3 [P.O.:120; I.V.:1481.3] Out: 400 [Urine:400] Intake/Output this shift:   Nutritional status: Diet NPO time specified  Neurologic Exam: Mental Status: Sitting up with eyes closed but opens with light sternal rub.  Follows some simple commands better using the left than the right.  Some clear spontaneous speech noted but mostly one to two words.    Cranial Nerves: V,VII: smile symmetric, facial light touch sensation normal bilaterally VIII: hearing normal bilaterally IX,X: gag reflex present XI: bilateral shoulder shrug XII: midline tongue extension Motor: Moves all extremities against gravity.  Myoclonus noted.    Lab Results: Results for orders placed or performed during the hospital encounter of 05/16/15 (from the past 48 hour(s))  Ammonia     Status: None   Collection Time: 05/19/15  9:58 AM  Result Value Ref Range   Ammonia 12 9 - 35 umol/L  TSH     Status: None   Collection Time: 05/19/15  9:59 AM  Result Value Ref Range   TSH 1.802 0.350 - 4.500 uIU/mL  CBC     Status: Abnormal   Collection Time: 05/20/15  5:19 AM  Result Value Ref Range   WBC 8.6 3.8 - 10.6 K/uL   RBC 3.57 (L) 4.40 - 5.90 MIL/uL   Hemoglobin 11.4 (L) 13.0 - 18.0 g/dL   HCT 34.0 (L) 40.0 - 52.0 %   MCV 95.2 80.0 - 100.0 fL   MCH 31.9 26.0 - 34.0 pg    MCHC 33.5 32.0 - 36.0 g/dL   RDW 13.7 11.5 - 14.5 %   Platelets 121 (L) 150 - 440 K/uL    No results found for this or any previous visit (from the past 240 hour(s)).  Lipid Panel No results for input(s): CHOL, TRIG, HDL, CHOLHDL, VLDL, LDLCALC in the last 72 hours.  Studies/Results: Dg Chest Port 1 View  05/20/2015  CLINICAL DATA:  Memory loss with difficulty speaking for 2 weeks. History of stroke. EXAM: PORTABLE CHEST 1 VIEW COMPARISON:  05/14/2015 and 04/04/2015. FINDINGS: 1025 hours. There are lower lung volumes with mildly increased atelectasis at both lung bases. No airspace disease, edema, pleural effusion or pneumothorax identified. The heart size and mediastinal contours are stable status post median sternotomy and aortic valve replacement. No acute osseous findings are seen. IMPRESSION: Lower lung volumes with mildly increased bibasilar atelectasis. No other significant changes. Electronically Signed   By: Richardean Sale M.D.   On: 05/20/2015 10:45    Medications:  I have reviewed the patient's current medications. Scheduled: . amLODipine  10 mg Oral Daily  . aspirin EC  81 mg Oral Daily  . budesonide (PULMICORT) nebulizer solution  0.25 mg Nebulization BID  . calcium carbonate  1 tablet Oral QHS  . carvedilol  25 mg Oral BID WC  .  hydrALAZINE  20 mg Intravenous Q4H  . losartan  50 mg Oral BID  . magnesium oxide  400 mg Oral QHS  . multivitamin with minerals  1 tablet Oral Daily  . nitroGLYCERIN  0.5 inch Topical Q6H  . sodium chloride flush  3 mL Intravenous Q12H  . valproate sodium  1,000 mg Intravenous Once  . valproate sodium  500 mg Intravenous Q12H  . vitamin C  1,000 mg Oral QHS  . Vitamin D (Ergocalciferol)  50,000 Units Oral Daily  . vitamin E  400 Units Oral QHS  . zinc sulfate  220 mg Oral QHS    Assessment/Plan: Patient improved when evaluated yesterday afternoon.  Speech more fluent.  Following commands.  Today not doing as well and had worsening  overnight.  Unclear if related to [place].  PO Keppra unable to be given.  Myoclonus continues.  Have discussed with family the possibility that this may be a degenerative disorder.  Further work up with LP discussed.  Family is declining at this time and feel this would be the patient's wishes as well.  Recommendations: 1.  D/C Keppra 2.  May restart sq heparin 3.  Depakote 1000mg  IV load today with maintenance to be continued of 500mg  IV q12 hours 4.  Depakote level in AM 5.  Would withhold po administrations until patient deemed safe.  Patient has an advanced directive.  6.  Continue BP management    LOS: 1 day   Alexis Goodell, MD Neurology [phone] 05/20/2015  10:59 AM

## 2015-05-20 NOTE — Progress Notes (Signed)
Patient code status change to DNR and comfort care, awaiting for palliative care consult. Ativan 1mg  administer for agitation

## 2015-05-20 NOTE — Progress Notes (Addendum)
Patient ID: Jimmy Friedman, male   DOB: 21-Mar-1927, 82 y.o.   MRN: [id number] [place] [facility] [place] NAME: Jimmy Friedman    MR#:  [id number]  DATE OF BIRTH:  [birth year]  SUBJECTIVE:   Patient appears more worse today. He is very confused. Very restless. Complaining of pain in the bed and abdominal. Coughing with water sips. Daughter in the room. Does not open his eyes. Not able to held meaningful conversation.  REVIEW OF SYSTEMS:   ROS Tolerating Diet:No  DRUG ALLERGIES:   Allergies  Allergen Reactions  . Penicillins Swelling and Other (See Comments)    Reaction:  Feet swelling  Has patient had a PCN reaction causing immediate rash, facial/tongue/throat swelling, SOB or lightheadedness with hypotension: No Has patient had a PCN reaction causing severe rash involving mucus membranes or skin necrosis: No Has patient had a PCN reaction that required hospitalization No Has patient had a PCN reaction occurring within the last 10 years: No If all of the above answers are "NO", then may proceed with Cephalosporin use.  . Sulfamethoxazole-Trimethoprim Rash    VITALS:  Blood pressure 174/77, pulse 62, temperature 97.6 F (36.4 C), temperature source Oral, resp. rate 24, height 5\' 10"  (1.778 m), weight 68.493 kg (151 lb), SpO2 95 %.  PHYSICAL EXAMINATION:   Physical Exam  GENERAL:  82 y.o.-year-old patient lying in the bed with Moderate  acute distress. Critically ill  EYES: Pupils equal, round, reactive to light and accommodation. No scleral icterus. Extraocular muscles intact.  HEENT: Head atraumatic, normocephalic. Oropharynx and nasopharynx clear. Dry oral mucosa  NECK:  Supple, no jugular venous distention. No thyroid enlargement, no tenderness.  LUNGS: Normal breath sounds bilaterally, no wheezing, rales, rhonchi. No use of accessory muscles of respiration.  CARDIOVASCULAR: S1, S2 normal. No murmurs, rubs, or gallops. Mild  tachycardia  ABDOMEN: Soft, nontender, nondistended. Bowel sounds present. No organomegaly or mass.  EXTREMITIES: No cyanosis, clubbing or edema b/l.    NEUROLOGIC:Grossly intact. Patient moves all extremity as well. I noted today is bilateral jerking movements in both upper and lower extremity.. No facial droop. Psychiatry :  patient is confused Skin : No obvious rash, lesion, or ulcer.   LABORATORY PANEL:  CBC  Recent Labs Lab 05/20/15 0519  WBC 8.6  HGB 11.4*  HCT 34.0*  PLT 121*    Chemistries   Recent Labs Lab 05/16/15 1656 05/17/15 0436  NA 143 143  K 3.4* 3.8  CL 104 108  CO2 31 28  GLUCOSE 145* 126*  BUN 39* 37*  CREATININE 2.12* 2.01*  CALCIUM 11.7* 11.6*  MG 2.0  --   AST 26  --   ALT 19  --   ALKPHOS 44  --   BILITOT 1.7*  --    Cardiac Enzymes  Recent Labs Lab 05/16/15 1656  TROPONINI 0.04*   RADIOLOGY:  Dg Chest Port 1 View  05/20/2015  CLINICAL DATA:  Memory loss with difficulty speaking for 2 weeks. History of stroke. EXAM: PORTABLE CHEST 1 VIEW COMPARISON:  05/14/2015 and 04/04/2015. FINDINGS: 1025 hours. There are lower lung volumes with mildly increased atelectasis at both lung bases. No airspace disease, edema, pleural effusion or pneumothorax identified. The heart size and mediastinal contours are stable status post median sternotomy and aortic valve replacement. No acute osseous findings are seen. IMPRESSION: Lower lung volumes with mildly increased bibasilar atelectasis. No other significant changes. Electronically Signed   By: Caryl Comes.D.  On: 05/20/2015 10:45   ASSESSMENT AND PLAN:   Jimmy Friedman is a 82 y.o. male with a known history of cva without residual deficit, essential hypertension presenting with memory and speech issues. He has been experiencing memory loss and difficulty speaking for 2 days. He knows what he wants to say but intermittently cannot express this. On arriavl to ED noted to be hypertensive  SBP>200.   * Accelerated hypertension/with hypertensive encephalopathy /possible seizure silent -Patient's MRI on 05/14/2015 did from not show any acute hemorrhage. He has old parietal infarct -Likely given deteriorationwith possible neurodegenerative disorder  -Dr. Jules Husbands had a long discussion with patient's daughter and stepson. They do understand patient is declining progressively quickly. They understand poor prognosis. -Had a long discussion with patient's daughter afterwards. She tells me her goal is for her dad to be comfortable. She is agreeable for when necessary morphine and Ativan to keep him comfortable. Agreeable to palliative care. Case discussed with Dr. Megan Salon -Patient's daughter is the healthcare power of attorney. Patient is DO NOT RESUSCITATE. He does not want any artificial means of feeding as well -Continue IV fluids for now when necessary Ativan when necessary morphine hold all by mouth BP meds. IV hydralazine when necessary. Food for pleasure if patient able to take it -EEG done yesterday not good quality  * CKD stage III is stable. - IV fluids.  * DVT prophylaxis with Lovenox  Patient overall has declined clinically. This was discussed with patient's daughter.  Case discussed with Care Management/Social Worker. Management plans discussed with the family and they are in agreement.  CODE STATUS:DO NOT RESUSCITATE  DVT Prophylaxis: lovenox TOTAL Critical TIME TAKING CARE OF THIS PATIENT:30 minutes.  >50% time spent on counselling and coordination of care with daughter and neurology   D/C  DEPENDING ON CLINICAL CONDITION.  Note: This dictation was prepared with Dragon dictation along with smaller phrase technology. Any transcriptional errors that result from this process are unintentional.  Shynice Sigel M.D on 05/20/2015 at 2:16 PM  Between 7am to 6pm - Pager - [phone]  After 6pm go to [email or web address] - password [email or web address] [facility]   Office  [phone]  CC: Primary care physician; Golden Pop, MD

## 2015-05-20 NOTE — Consult Note (Signed)
Palliative Medicine Inpatient Consult Note   Name: Jimmy Friedman Date: 05/20/2015 MRN: [id number]  DOB: [birth year]  Referring Physician: Fritzi Mandes, MD  Palliative Care consult requested for this 82 y.o. male for goals of medical therapy in patient with a rapidly evolving neurodegenerative process.  DISCUSSIONS AND PLAN: I met with pt's daughter and also his step-son and step-son's wife this evening. Daughter is POA and reports that she is also HCPOA, though she did not have that document with her (she did have pt's Living Will and the POA document).  She has two brothers, one of which was texting her and agreeing with the plan for pts care as it was coming together as we spoke, and the other one en route here tonight.  There is not expected to be any disagreement with the daughter's decisions.  Daughter has asked about comfort/ terminal care options.  She is quite focused on honoring pts living will and she was reassured that even when we stop his IV fluids, that she has honored his wishes (we would not just keep fluids going to keep him alive as there would not be any endpoint to that --she understands).  She wishes for him to not go back into the extreme agony of pain (undefined location of pain) like he had this am.  She understands the pts course of decline has been atypical of either stroke, dementia, depression, or other neurodegenerative processes that might involve motor impairment or abnormalities. He seems to have had a little of all kinds of symptoms and it may be that he has a combination of issues coming together to cause this decline.  An MRA was done and did show some atherosclerosis of intracranial arteries --but no severe flow limiting stenosis was noted. He has moderate carotid atherosclerosis.  Hypertensive encephalopathy may be an explanation for some of his presentation.  His wife's death recently may have triggered some changes in neurotransmitters.  Further testing would  likely not result in reversal of his condition.    I discussed various options and presented choice of hospice agencies and settings -- and ultimately, daughter requested [facility] here in [place] on [address] --since that is where his wife died just about 3 wks ago.    I have ordered a social work consult to help arrange this.  I have completed the DC meds and there is a DNR from in the chart now.    Pt has been started on some low dose Roxanol with prn morphine ordered also. Other symptom meds are ordered.  I will update care team in the am.  -------------------------------------------------------   CLINICAL NARRATIVE. After establishing decision maker, I listened to the stories of how pt was three mos ago, three weeks ago (when his wife died at [facility]) and all this last week.  I have reviewed the record and also have spoken with Dr. Posey Pronto about pt's unusual rapid progression to an end stage dementia.  His course is documented elsewhere in the notes but includes the following: ---an episode of transient global amnesia ('blacking out while driving') about 3 or more mos ago ---aphasia treated with TPA in early April.  Full recovery and no findings on imaging of an acute CVA ---ED visit on Friday when pt called EMS himself reporting 2 days of trouble with speech--DCd home ---episodes of speaking nonsensically followed by clearing and normal speech ---rapid progression over the last 6 days from being independent to being bedbound, unable to swallow safely (without  findings on imaging that would identify the obvious cause ---known vascular disease (he is post CABG) ---episodes of accelerated HTN and suspected component of Hypertensive Encephalopathy ---an episode of extreme pain that pt was unable to communicate about other than to writhe and moan in agony this am ---occasional motor issues (he has fallen a few times in last few months) ---a chronic (old) small left parietal CVA  seen on brain MRI ---absence of fever  ---seizures not ruled out but not thought to explain pts course and symptoms. ---He has started Myoclonic jerking and Keppra was used to help ---these have stopped.   Neurologist, Dr Doy Mince, has talked with family extensively about pt's rapid decline and the grim prognosis no matter whether more testing is done or not. Pt has a living will requesting no artificial feedings.  His Living will does request hydration if indicated, so pt has been continued on fluids, though he is not eating. He was made NPO this am due to his lack of alertness making him unsafe for feeding.  LP was declined due to his poor prognosis overall.  He had at presentation, some hypercalcemia and acute renal failure related to poor oral intake and dehydration. He is mildly anemia and platelets were slightly low.     This morning, he seemed to be writhing in pain and morphine was given. Since then, he has been sleeping and not showing signs of grimacing or moaning or writhing in pain.  Daughter would like him kept from having that kind of pain again.  Daughter is asking about terminal comfort care options.    -------------------------------------------------  REVIEW OF SYSTEMS:  Patient is not able to provide ROS due to being cognitively impaired  SPIRITUAL SUPPORT SYSTEM: Yes.  SOCIAL HISTORY:  reports that he has quit smoking. He has never used smokeless tobacco. He reports that he drinks alcohol. He reports that he does not use illicit drugs.  LEGAL DOCUMENTS:  A DNR form is now in chart.  Daughter carries pts Living Will and POA forms with her (she did not locate the HCPOA form but it exists per report)  CODE STATUS: DNR  PAST MEDICAL HISTORY: Past Medical History  Diagnosis Date  . Coronary artery disease   . MI (myocardial infarction) ([place])   . S/P AVR (aortic valve replacement)   . Prostate cancer ([place])   . Kidney problem   . Hypertension   . Hyperlipidemia   .  Chronic prostatitis   . TIA (transient ischemic attack)     PAST SURGICAL HISTORY:  Past Surgical History  Procedure Laterality Date  . Coronary artery bypass graft    . Aortic valve replacement    . Kidney surgery      ALLERGIES:  is allergic to penicillins and sulfamethoxazole-trimethoprim.  MEDICATIONS:  Current Facility-Administered Medications  Medication Dose Route Frequency Provider Last Rate Last Dose  . 0.9 %  sodium chloride infusion   Intravenous Continuous Fritzi Mandes, MD 75 mL/hr at 05/20/15 1839    . acetaminophen (TYLENOL) tablet 650 mg  650 mg Oral Q6H PRN Lytle Butte, MD       Or  . acetaminophen (TYLENOL) suppository 650 mg  650 mg Rectal Q6H PRN Lytle Butte, MD      . aspirin EC tablet 81 mg  81 mg Oral Daily Lytle Butte, MD   81 mg at 05/18/15 1017  . bisacodyl (DULCOLAX) suppository 10 mg  10 mg Rectal Daily PRN Fritzi Mandes, MD      .  budesonide (PULMICORT) nebulizer solution 0.25 mg  0.25 mg Nebulization BID Lytle Butte, MD   0.25 mg at 05/20/15 0744  . calcium carbonate (TUMS - dosed in mg elemental calcium) chewable tablet 200 mg of elemental calcium  1 tablet Oral QHS Lytle Butte, MD   200 mg of elemental calcium at 05/18/15 2128  . hydrALAZINE (APRESOLINE) injection 20 mg  20 mg Intravenous Q4H Fritzi Mandes, MD   20 mg at 05/20/15 1333  . LORazepam (ATIVAN) injection 1 mg  1 mg Intravenous Q4H PRN Fritzi Mandes, MD   1 mg at 05/20/15 1432  . magnesium oxide (MAG-OX) tablet 400 mg  400 mg Oral QHS Lytle Butte, MD   400 mg at 05/18/15 2129  . morphine 2 MG/ML injection 2 mg  2 mg Intravenous Q3H PRN Fritzi Mandes, MD      . multivitamin with minerals tablet 1 tablet  1 tablet Oral Daily Lytle Butte, MD   1 tablet at 05/18/15 1017  . nitroGLYCERIN (NITROGLYN) 2 % ointment 0.5 inch  0.5 inch Topical Q6H Fritzi Mandes, MD   0.5 inch at 05/20/15 1838  . ondansetron (ZOFRAN) tablet 4 mg  4 mg Oral Q6H PRN Lytle Butte, MD       Or  . ondansetron [facility])  injection 4 mg  4 mg Intravenous Q6H PRN Lytle Butte, MD      . oxyCODONE (Oxy IR/ROXICODONE) immediate release tablet 5 mg  5 mg Oral Q4H PRN Lytle Butte, MD      . sodium chloride flush (NS) 0.9 % injection 3 mL  3 mL Intravenous Q12H Lytle Butte, MD   3 mL at 05/20/15 1028  . [START ON 05/21/2015] valproate (DEPACON) 500 mg in dextrose 5 % 50 mL IVPB  500 mg Intravenous Q12H Alexis Goodell, MD      . vitamin C (ASCORBIC ACID) tablet 1,000 mg  1,000 mg Oral QHS Lytle Butte, MD   1,000 mg at 05/18/15 2129  . Vitamin D (Ergocalciferol) (DRISDOL) capsule 50,000 Units  50,000 Units Oral Daily Lytle Butte, MD   50,000 Units at 05/18/15 1016  . vitamin E capsule 400 Units  400 Units Oral QHS Lytle Butte, MD   400 Units at 05/18/15 2129  . zinc sulfate capsule 220 mg  220 mg Oral QHS Lytle Butte, MD   220 mg at 05/18/15 2129    Vital Signs: BP 174/77 mmHg  Pulse 62  Temp(Src) 97.6 F (36.4 C) (Oral)  Resp 24  Ht _0  (1.778 m)  Wt 68.493 kg (151 lb)  BMI 21.67 kg/m2  SpO2 95% Filed Weights   05/16/15 1645  Weight: 68.493 kg (151 lb)    Estimated body mass index is 21.67 kg/(m^2) as calculated from the following:   Height as of this encounter: _1  (1.778 m).   Weight as of this encounter: 68.493 kg (151 lb).  PERFORMANCE STATUS (ECOG) : 4 - Bedbound  PHYSICAL EXAM: Sleeping and not wakened Eyes closed No JVD or TM hrt rrr no m Lungs cta  Abd soft and NT Ext no mottling or cyanosis No unusual movements noted.  LABS: CBC:    Component Value Date/Time   WBC 8.6 05/20/2015 0519   HGB 11.4* 05/20/2015 0519   HCT 34.0* 05/20/2015 0519   PLT 121* 05/20/2015 0519   MCV 95.2 05/20/2015 0519   NEUTROABS 5.8 05/16/2015 1656   LYMPHSABS 0.9* 05/16/2015 1656  MONOABS 1.0 05/16/2015 1656   EOSABS 0.2 05/16/2015 1656   BASOSABS 0.0 05/16/2015 1656   Comprehensive Metabolic Panel:    Component Value Date/Time   NA 143 05/17/2015 0436   NA 141 03/02/2015 0838    K 3.8 05/17/2015 0436   CL 108 05/17/2015 0436   CO2 28 05/17/2015 0436   BUN 37* 05/17/2015 0436   BUN 26 03/02/2015 0838   CREATININE 2.01* 05/17/2015 0436   GLUCOSE 126* 05/17/2015 0436   GLUCOSE 129* 03/02/2015 0838   CALCIUM 11.6* 05/17/2015 0436   AST 26 05/16/2015 1656   ALT 19 05/16/2015 1656   ALKPHOS 44 05/16/2015 1656   BILITOT 1.7* 05/16/2015 1656   BILITOT 0.8 03/02/2015 0838   PROT 7.0 05/16/2015 1656   PROT 6.3 03/02/2015 0838   ALBUMIN 4.1 05/16/2015 1656   ALBUMIN 4.0 03/02/2015 0838    More than 50% of the visit was spent in counseling/coordination of care: Yes  Time Spent: 120 minutes

## 2015-05-20 NOTE — Progress Notes (Signed)
Patient agitated, appears to be in pain. Does not answer questions when asked. Keeps trying to get out of bed, grimacing, and yelling out. Morphine given. WIll continue to monitor.

## 2015-05-20 NOTE — Progress Notes (Signed)
Paged [facility] doctor regarding patient inability to take medications. Patient appears confused. Attempted to give patient a small bite of applesauce. Patient did not attempt to swallow instead holding food in front of mouth. Swabbed applesauce back out of mouth. Will not attempt to give any PO medications. Will continue to try to page MD.

## 2015-05-20 NOTE — Progress Notes (Signed)
Notified Dr. Estanislado Pandy of change in patient status this shift and of patient inability to take any PO meds. Also notified of BP of 172/75. Will give ordered PRN hydralazine.

## 2015-05-20 NOTE — Care Management (Signed)
Chart reviewed. May benefit from a palliative care consult. Will discuss with attending.

## 2015-05-21 MED ORDER — SCOPOLAMINE 1 MG/3DAYS TD PT72
1.0000 | MEDICATED_PATCH | TRANSDERMAL | Status: DC
  Administered 2015-05-21: 1.5 mg via TRANSDERMAL
  Filled 2015-05-21: qty 1

## 2015-05-21 NOTE — Progress Notes (Addendum)
New hospice home referral received from [facility] following a Palliative Care consult with Dr. Megan Salon. Jimmy Friedman is an 82 year old man admitted to [facility] on 5/7 for evaluation of confusion and elevated blood pressure. Upon admission Jimmy Friedman was alert and oriented, he has progressed to being minimally response. He has been evaluated by neurology and family has met with Palliative Care physician Dr. Megan Salon,. His condition continues to deteriorate, family has chosen to focus on his comfort at end of life and wishes for him to be transferred to the hospice home.  His PMH includes: Prostate Ca, MI, aortic valvle replacement, TIA, HTN,HLD, chronic prostatitis and kidney problem.  Patient seen lying in bed appeared to be asleep, audible secretions noted, scopolamine  patch in place for secretion management. Patient had just received a dose of scheduled liquid morphine for management tof pain. He has also required IV lorazepam 1 mg for restlessness/agitaion. Writer met in the family room with patient's daughter Jimmy Friedman and son Jimmy Friedman to initiate education regarding hospice services, philosophy and team approach to carte with good understanding voiced. Questions answered. Consents signed. Patient information faxed to referral intake. Report called to the Hopsice home. Writer to notify EMS for transport when Hospice home is ready to accept patient. Hospital care team made aware. Thank you for the opportunity to be involved in the care of this patient ad his family. Flo Shanks RN, BSN, [place] [facility] [place] [phone] c

## 2015-05-21 NOTE — Care Management Important Message (Signed)
Important Message  Patient Details  Name: DOM PUDWILL MRN: [id number] Date of Birth: 1927/10/20   Medicare Important Message Given:  Yes    Juliann Pulse A Jessejames Steelman 05/21/2015, 10:44 AM

## 2015-05-21 NOTE — Progress Notes (Signed)
Palliative Medicine Inpatient Consult Follow Up Note   Name: Jimmy Friedman Date: 05/21/2015 MRN: [id number]  DOB: 1927/05/18  Referring Physician: Fritzi Mandes, MD  Palliative Care consult requested for this 82 y.o. male for goals of medical therapy in patient with rapidly progressing neurodegenerative process.  PLAN: Pt is to go to [facility] today. More family has arrived. Family is satisfied with the current approach to care and plan for Hospice Home. Pt did require a scopolamine patch today.  -----------------------------------------------------------------  CLINICAL NARRATIVE. After establishing decision maker, I listened to the stories of how pt was three mos ago, three weeks ago (when his wife died at [facility]) and all this last week. I have reviewed the record and also have spoken with Dr. Posey Pronto about pt's unusual rapid progression to an end stage dementia. His course is documented elsewhere in the notes but includes the following: ---an episode of transient global amnesia ('blacking out while driving') about 3 or more mos ago ---aphasia treated with TPA in early April. Full recovery and no findings on imaging of an acute CVA ---ED visit on Friday when pt called EMS himself reporting 2 days of trouble with speech--DCd home ---episodes of speaking nonsensically followed by clearing and normal speech ---rapid progression over the last 6 days from being independent to being bedbound, unable to swallow safely (without findings on imaging that would identify the obvious cause ---known vascular disease (he is post CABG) ---episodes of accelerated HTN and suspected component of Hypertensive Encephalopathy ---an episode of extreme pain that pt was unable to communicate about other than to writhe and moan in agony this am ---occasional motor issues (he has fallen a few times in last few months) ---a chronic (old) small left parietal CVA seen on brain MRI ---absence of fever   ---seizures not ruled out but not thought to explain pts course and symptoms. ---He has started Myoclonic jerking and Keppra was used to help ---these have stopped.   Neurologist, Dr Doy Mince, has talked with family extensively about pt's rapid decline and the grim prognosis no matter whether more testing is done or not. Pt has a living will requesting no artificial feedings. His Living will does request hydration if indicated, so pt has been continued on fluids, though he is not eating. He was made NPO this am due to his lack of alertness making him unsafe for feeding. LP was declined due to his poor prognosis overall.  He had at presentation, some hypercalcemia and acute renal failure related to poor oral intake and dehydration. He is mildly anemia and platelets were slightly low.   Yesterday morning, he seemed to be writhing in pain and morphine was given. Since then, he has been sleeping and not showing signs of grimacing or moaning or writhing in pain. Daughter would like him kept from having that kind of pain again.  ------------------------------------------------------------   CODE STATUS: DNR   PAST MEDICAL HISTORY: Past Medical History  Diagnosis Date  . Coronary artery disease   . MI (myocardial infarction) ([place])   . S/P AVR (aortic valve replacement)   . Prostate cancer ([place])   . Kidney problem   . Hypertension   . Hyperlipidemia   . Chronic prostatitis   . TIA (transient ischemic attack)     PAST SURGICAL HISTORY:  Past Surgical History  Procedure Laterality Date  . Coronary artery bypass graft    . Aortic valve replacement    . Kidney surgery      Vital Signs:  BP 178/75 mmHg  Pulse 76  Temp(Src) 97.8 F (36.6 C) (Axillary)  Resp 24  Ht 5\' 10"  (1.778 m)  Wt 68.493 kg (151 lb)  BMI 21.67 kg/m2  SpO2 91% Filed Weights   05/16/15 1645  Weight: 68.493 kg (151 lb)    Estimated body mass index is 21.67 kg/(m^2) as calculated from the following:    Height as of this encounter: 5\' 10"  (1.778 m).   Weight as of this encounter: 68.493 kg (151 lb).  PHYSICAL EXAM: Pt was quite restless. Eyes closed Starting to grab rails with hands and moving legs No JVD or TM Hrt rrr no m Lungs cta Abd soft and NT Ext no mottling or cyanosis  LABS: CBC:    Component Value Date/Time   WBC 8.6 05/20/2015 0519   HGB 11.4* 05/20/2015 0519   HCT 34.0* 05/20/2015 0519   PLT 121* 05/20/2015 0519   MCV 95.2 05/20/2015 0519   NEUTROABS 5.8 05/16/2015 1656   LYMPHSABS 0.9* 05/16/2015 1656   MONOABS 1.0 05/16/2015 1656   EOSABS 0.2 05/16/2015 1656   BASOSABS 0.0 05/16/2015 1656   Comprehensive Metabolic Panel:    Component Value Date/Time   NA 143 05/17/2015 0436   NA 141 03/02/2015 0838   K 3.8 05/17/2015 0436   CL 108 05/17/2015 0436   CO2 28 05/17/2015 0436   BUN 37* 05/17/2015 0436   BUN 26 03/02/2015 0838   CREATININE 2.01* 05/17/2015 0436   GLUCOSE 126* 05/17/2015 0436   GLUCOSE 129* 03/02/2015 0838   CALCIUM 11.6* 05/17/2015 0436   AST 26 05/16/2015 1656   ALT 19 05/16/2015 1656   ALKPHOS 44 05/16/2015 1656   BILITOT 1.7* 05/16/2015 1656   BILITOT 0.8 03/02/2015 0838   PROT 7.0 05/16/2015 1656   PROT 6.3 03/02/2015 0838   ALBUMIN 4.1 05/16/2015 1656   ALBUMIN 4.0 03/02/2015 0838   More than 50% of the visit was spent in counseling/coordination of care: YES  Time Spent: 15 min

## 2015-05-21 NOTE — Clinical Social Work Note (Signed)
Clinical Social Work Assessment  Patient Details  Name: Jimmy Friedman MRN: [id number] Date of Birth: 07-01-1927  Date of referral:  05/21/15               Reason for consult:  Other (Comment Required) (Hospice Facility placement/referral)                Permission sought to share information with:  Case Manager, Customer service manager, Family Supports Permission granted to share information::  Yes, Verbal Permission Granted  Name::        Agency::  Hospice Home: [place]: karen  Relationship::  Daughter: Hansel Feinstein Information:     Housing/Transportation Living arrangements for the past 2 months:  Single Family Home Source of Information:  Case Freight forwarder, Adult Children Patient Interpreter Needed:  None Criminal Activity/Legal Involvement Pertinent to Current Situation/Hospitalization:  No - Comment as needed Significant Relationships:  Adult Children, Other Family Members Lives with:  Self Do you feel safe going back to the place where you live?  No (referral. hospice) Need for family participation in patient care:  Yes (Comment) (due to medical care.)  Care giving concerns:  LCSW spoke with daughter via phone (EVE) and she reported plan is full comfort care at this time for patient. She reports her step mother recently passed away at [facility] and she is hoping patient will also be able to be transferred to facility.     Social Worker assessment / plan:  LCSW completed assessment and educated daughter on role while in hospital. Daughter agreeable to referral for hospice home placement and has chosen [facility] completed referral to [facility] and will assist with disposition. Bed available per [facility] and she is working on paperwork with family.  Employment status:  Retired Forensic scientist:  Medicare PT Recommendations:  Not assessed at this time [place] / Referral to community resources:  Other (Comment Required)  (hospice home information)  Patient/Family's Response to care:  Agreeable to plan  Patient/Family's Understanding of and Emotional Response to Diagnosis, Current Treatment, and Prognosis:  Daughter voices hope for patient and comfort as she has come to terms with his current medical state and wanting placement in hospice home. She voices positive care while in hospital and with patient and agreeable to plan at DC to hospice home.  Emotional Assessment Appearance:  Appears stated age Attitude/Demeanor/Rapport:  Other (calm and resting) Affect (typically observed):  Accepting Orientation:  Oriented to Self Alcohol / Substance use:  Not Applicable Psych involvement (Current and /or in the community):  No (Comment)  Discharge Needs  Concerns to be addressed:  No discharge needs identified Readmission within the last 30 days:  No Current discharge risk:  None Barriers to Discharge:  No Barriers Identified   [place]illy [place], LCSW 05/21/2015, 10:10 AM

## 2015-05-21 NOTE — Progress Notes (Signed)
Patient has been made comfort care. Minimally responsive. Incontinent. Medicated for pain and agitation. IV fluids still infusing. Sitter at side. To be discharged to Hospice today.

## 2015-05-21 NOTE — Progress Notes (Signed)
EMS notified for transport to the hospice home at 5:15 pm. Family and staff RN Janett Billow made aware.  Flo Shanks RN, BSN, [place] and [facility] [place] Liaison [phone] c

## 2015-05-21 NOTE — Discharge Summary (Signed)
[facility] NAME: Jimmy Friedman    MR#:  [id number]  DATE OF BIRTH:  1927/11/09  DATE OF ADMISSION:  05/16/2015 ADMITTING PHYSICIAN: Lytle [place]te, MD  DATE OF DISCHARGE:05/21/15  PRIMARY CARE PHYSICIAN: Golden Pop, MD    ADMISSION DIAGNOSIS:  Hypertensive encephalopathy [I67.4] History of CVA (cerebrovascular accident) [Z86.73] Hypertensive urgency [I16.0]  DISCHARGE DIAGNOSIS:  Hypertensive encephalopathy Progressive rapid decline in mentation  SECONDARY DIAGNOSIS:   Past Medical History  Diagnosis Date  . Coronary artery disease   . MI (myocardial infarction) ([place])   . S/P AVR (aortic valve replacement)   . Prostate cancer ([place])   . Kidney problem   . Hypertension   . Hyperlipidemia   . Chronic prostatitis   . TIA (transient ischemic attack)     HOSPITAL COURSE:  Jimmy Friedman is a 82 y.o. male with a known history of cva without residual deficit, essential hypertension presenting with memory and speech issues. He has been experiencing memory loss and difficulty speaking for 2 days. He knows what he wants to say but intermittently cannot express this. On arriavl to ED noted to be hypertensive SBP>200.   * Accelerated hypertension/with hypertensive encephalopathy -Patient's MRI on 05/14/2015 did from not show any acute hemorrhage. He has old parietal infarct -Likely given deterioration with possible neurodegenerative disorder  -Dr. Doy Mince had a long discussion with patient's daughter and stepson. They do understand patient is declining progressively quickly. They understand poor prognosis. -Had a long discussion with patient's daughter afterwards. Comfort care was requested by family. She is agreeable for when necessary morphine and Ativan to keep him comfortable.  -appreciate palliative care with Dr. Megan Salon -Patient's daughter is the healthcare power of attorney. Patient is DO NOT  RESUSCITATE. -pt to go to hospice facility today  * CKD stage III is stable. -received  IV fluids.  * DVT prophylaxis with Lovenox  CONSULTS OBTAINED:  Treatment Team:  Lytle Butte, MD Catarina Hartshorn, MD  DRUG ALLERGIES:   Allergies  Allergen Reactions  . Penicillins Swelling and Other (See Comments)    Reaction:  Feet swelling  Has patient had a PCN reaction causing immediate rash, facial/tongue/throat swelling, SOB or lightheadedness with hypotension: No Has patient had a PCN reaction causing severe rash involving mucus membranes or skin necrosis: No Has patient had a PCN reaction that required hospitalization No Has patient had a PCN reaction occurring within the last 10 years: No If all of the above answers are "NO", then may proceed with Cephalosporin use.  . Sulfamethoxazole-Trimethoprim Rash    DISCHARGE MEDICATIONS:   Current Discharge Medication List    START taking these medications   Details  bisacodyl (DULCOLAX) 10 MG suppository Place 1 suppository (10 mg total) rectally daily as needed for moderate constipation. Qty: 6 suppository, Refills: 0    LORazepam (ATIVAN) 0.5 MG tablet Take 1 tablet (0.5 mg total) by mouth every hour as needed for anxiety (anxiety or restlessness). Qty: 15 tablet, Refills: 0    !! Morphine Sulfate (MORPHINE CONCENTRATE) 10 MG/0.5ML SOLN concentrated solution Take 0.25 mLs (5 mg total) by mouth every hour as needed for moderate pain, severe pain or shortness of breath. Qty: 30 mL, Refills: 0    !! Morphine Sulfate (MORPHINE CONCENTRATE) 10 MG/0.5ML SOLN concentrated solution Take 0.25 mLs (5 mg total) by mouth every 6 (six) hours. Qty: 30 mL, Refills: 0    prochlorperazine (COMPAZINE) 25 MG suppository Place 1 suppository (25  mg total) rectally every 8 (eight) hours as needed for nausea or vomiting. Qty: 6 suppository, Refills: 0     !! - Potential duplicate medications found. Please discuss with provider.    STOP taking  these medications     aspirin EC 81 MG tablet      carvedilol (COREG) 6.25 MG tablet      Cholecalciferol (VITAMIN D3) 5000 units TABS      clotrimazole-betamethasone (LOTRISONE) cream      doxycycline (VIBRA-TABS) 100 MG tablet      fluticasone (FLOVENT HFA) 220 MCG/ACT inhaler      furosemide (LASIX) 20 MG tablet      glucosamine-chondroitin 500-400 MG tablet      Multiple Minerals (CALCIUM-MAGNESIUM-ZINC) TABS      Multiple Vitamin (MULTIVITAMIN WITH MINERALS) TABS tablet      pravastatin (PRAVACHOL) 40 MG tablet      vitamin C (ASCORBIC ACID) 500 MG tablet      vitamin E 400 UNIT capsule      losartan (COZAAR) 25 MG tablet         If you experience worsening of your admission symptoms, develop shortness of breath, life threatening emergency, suicidal or homicidal thoughts you must seek medical attention immediately by calling 911 or calling your MD immediately  if symptoms less severe.  You Must read complete instructions/literature along with all the possible adverse reactions/side effects for all the Medicines you take and that have been prescribed to you. Take any new Medicines after you have completely understood and accept all the possible adverse reactions/side effects.   Please note  You were cared for by a hospitalist during your hospital stay. If you have any questions about your discharge medications or the care you received while you were in the hospital after you are discharged, you can call the unit and asked to speak with the hospitalist on call if the hospitalist that took care of you is not available. Once you are discharged, your primary care physician will handle any further medical issues. Please note that NO REFILLS for any discharge medications will be authorized once you are discharged, as it is imperative that you return to your primary care physician (or establish a relationship with a primary care physician if you do not have one) for your aftercare  needs so that they can reassess your need for medications and monitor your lab values. Today   SUBJECTIVE   lethargic   VITAL SIGNS:  Blood pressure 179/77, pulse 62, temperature 97.8 F (36.6 C), temperature source Axillary, resp. rate 24, height 5\' 10"  (1.778 m), weight 68.493 kg (151 lb), SpO2 94 %.  I/O:   Intake/Output Summary (Last 24 hours) at 05/21/15 0710 Last data filed at 05/21/15 0445  Gross per 24 hour  Intake 597.08 ml  Output    350 ml  Net 247.08 ml    PHYSICAL EXAMINATION:  GENERAL:  82 y.o.-year-old patient lying in the bed with no acute distress. Appears ill EYES: Pupils equal, round, reactive to light and accommodation. No scleral icterus. Extraocular muscles intact.  HEENT: Head atraumatic, normocephalic. Oropharynx and nasopharynx clear.  NECK:  Supple, no jugular venous distention. No thyroid enlargement, no tenderness.  LUNGS: Normal breath sounds bilaterally, no wheezing, rales,rhonchi or crepitation. No use of accessory muscles of respiration.  CARDIOVASCULAR: S1, S2 normal. No murmurs, rubs, or gallops.  ABDOMEN: Soft, non-tender, non-distended.few Bowel sounds present. No organomegaly or mass.  EXTREMITIES: No pedal edema, cyanosis, or clubbing.  NEUROLOGIC:unable to  assessPSYCHIATRIC: lethargic SKIN: No obvious rash, lesion, or ulcer.   DATA REVIEW:   CBC   Recent Labs Lab 05/20/15 0519  WBC 8.6  HGB 11.4*  HCT 34.0*  PLT 121*    Chemistries   Recent Labs Lab 05/16/15 1656 05/17/15 0436  NA 143 143  K 3.4* 3.8  CL 104 108  CO2 31 28  GLUCOSE 145* 126*  BUN 39* 37*  CREATININE 2.12* 2.01*  CALCIUM 11.7* 11.6*  MG 2.0  --   AST 26  --   ALT 19  --   ALKPHOS 44  --   BILITOT 1.7*  --     Microbiology Results   No results found for this or any previous visit (from the past 240 hour(s)).  RADIOLOGY:  Dg Chest Port 1 View  05/20/2015  CLINICAL DATA:  Memory loss with difficulty speaking for 2 weeks. History of stroke.  EXAM: PORTABLE CHEST 1 VIEW COMPARISON:  05/14/2015 and 04/04/2015. FINDINGS: 1025 hours. There are lower lung volumes with mildly increased atelectasis at both lung bases. No airspace disease, edema, pleural effusion or pneumothorax identified. The heart size and mediastinal contours are stable status post median sternotomy and aortic valve replacement. No acute osseous findings are seen. IMPRESSION: Lower lung volumes with mildly increased bibasilar atelectasis. No other significant changes. Electronically Signed   By: Richardean Sale M.D.   On: 05/20/2015 10:45     Management plans discussed with the patient, family and they are in agreement.  CODE STATUS:     Code Status Orders        Start     Ordered   05/20/15 1334  Do not attempt resuscitation (DNR)   Continuous    Question Answer Comment  In the event of cardiac or respiratory ARREST Do not call a "code blue"   In the event of cardiac or respiratory ARREST Do not perform Intubation, CPR, defibrillation or ACLS   In the event of cardiac or respiratory ARREST Use medication by any route, position, wound care, and other measures to relive pain and suffering. May use oxygen, suction and manual treatment of airway obstruction as needed for comfort.      05/20/15 1333    Code Status History    Date Active Date Inactive Code Status Order ID Comments User Context   05/16/2015  7:53 PM 05/20/2015  1:33 PM Full Code [id number]  Lytle Butte, MD ED   04/09/2015  1:38 AM 04/09/2015  8:13 PM Full Code [id number]  Saundra Shelling, MD ED    Advance Directive Documentation        Most Recent Value   Type of Advance Directive  Living will   Pre-existing out of facility DNR order (yellow form or pink MOST form)     "MOST" Form in Place?        TOTAL TIME TAKING CARE OF THIS PATIENT: 40 minutes.    Jimmy Friedman M.D on 05/21/2015 at 7:10 AM  Between 7am to 6pm - Pager - [phone] After 6pm go to [email or web address] - password [email or web address] [facility]  Office  [phone]  CC: Primary care physician; Golden Pop, MD

## 2015-05-27 ENCOUNTER — Inpatient Hospital Stay: Payer: Medicare Other | Admitting: Family Medicine

## 2015-05-31 ENCOUNTER — Ambulatory Visit: Payer: Medicare Other | Admitting: Cardiovascular Disease

## 2015-06-10 DEATH — deceased

## 2015-08-23 ENCOUNTER — Ambulatory Visit: Payer: Medicare Other | Admitting: Family Medicine

## 2018-01-22 IMAGING — CT CT HEAD W/O CM
1 series · 16 of 30 positions shown, 20 images · non-contrast
Comparison: 04/10/2015

CLINICAL DATA: Speech difficulty, possible CVA

EXAM:
CT HEAD WITHOUT CONTRAST
TECHNIQUE: Contiguous axial images were obtained from the base of the skull
through the vertex without intravenous contrast.

[Series 2: head wo · axial · 0.44mm/px · z∈[-145,-10]mm · 16 of 31 slices shown, 20 images]
[im 2/31  brain]
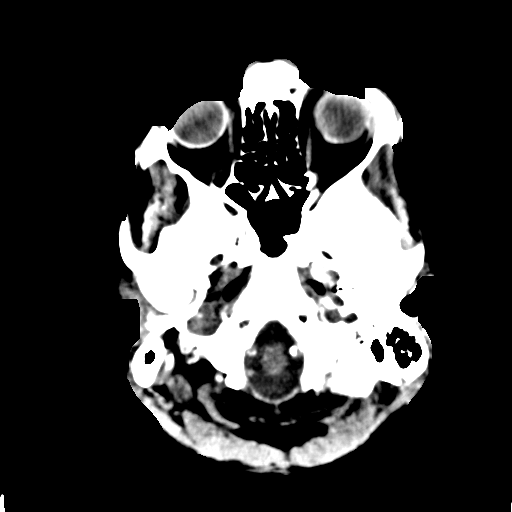
[im 2/31  bone]
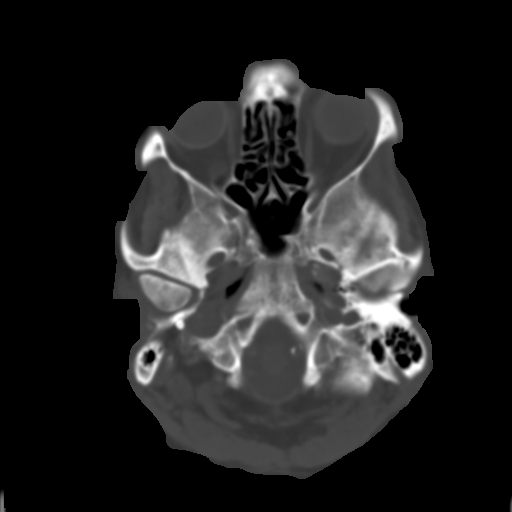
[im 4/31  brain]
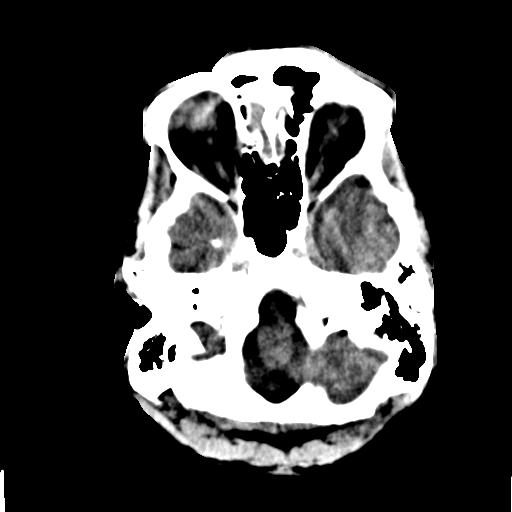
[im 6/31  brain]
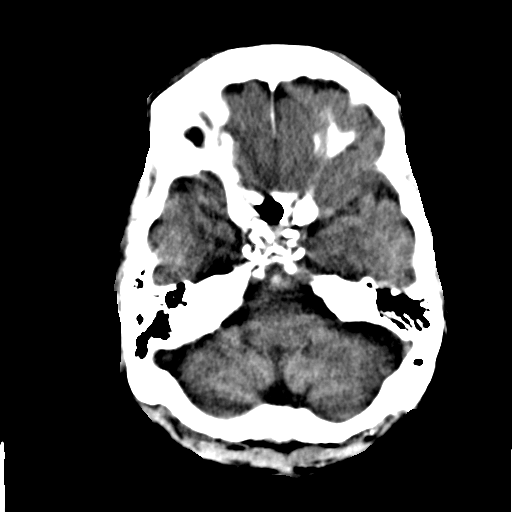
[im 8/31  brain]
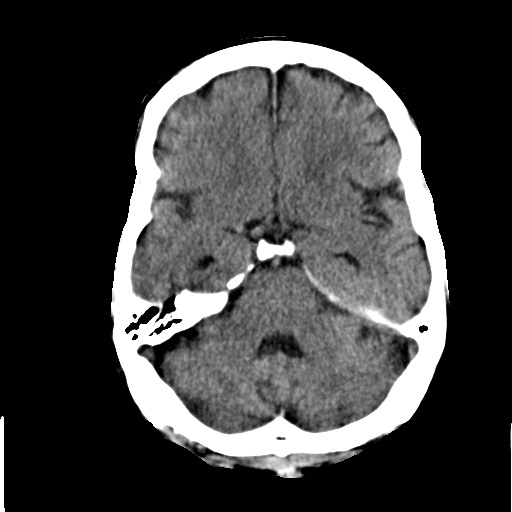
[im 9/31  brain]
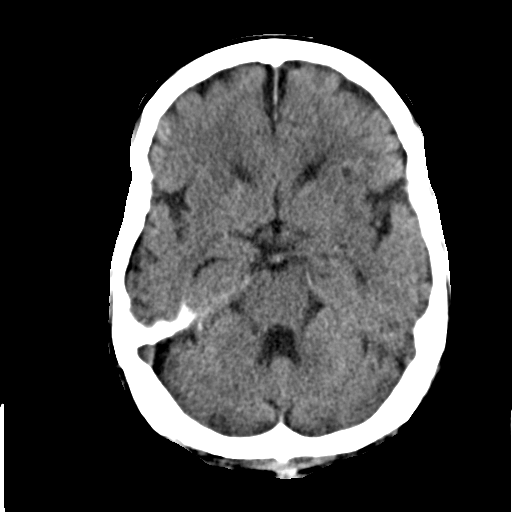
[im 9/31  bone]
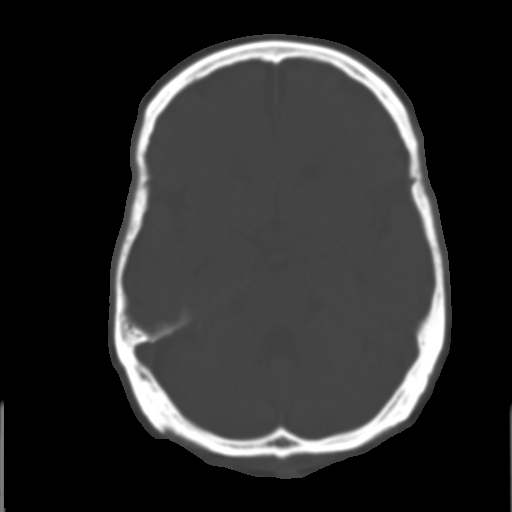
[im 11/31  brain]
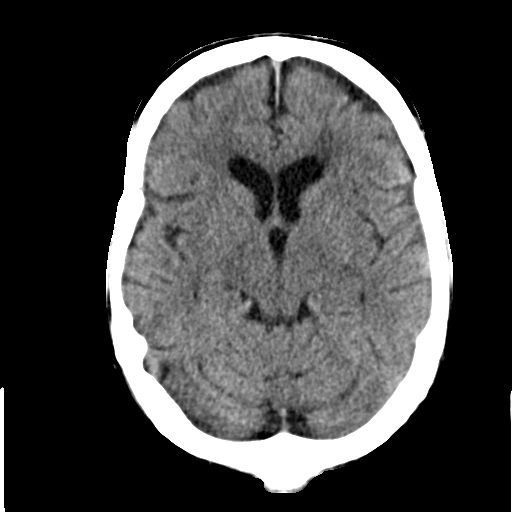
[im 13/31  brain]
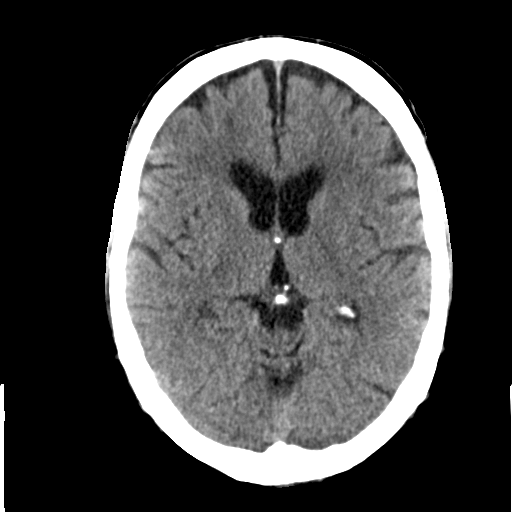
[im 15/31  brain]
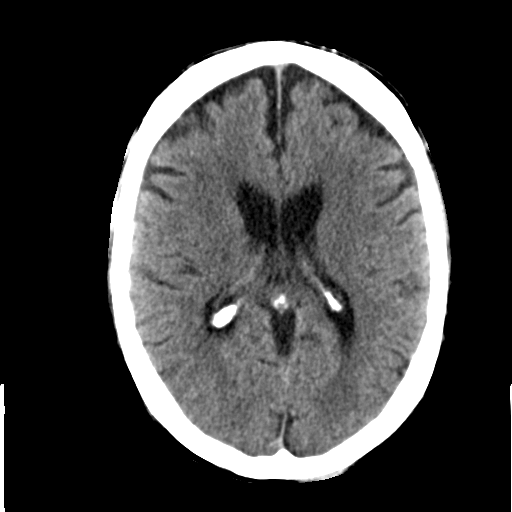
[im 16/31  brain]
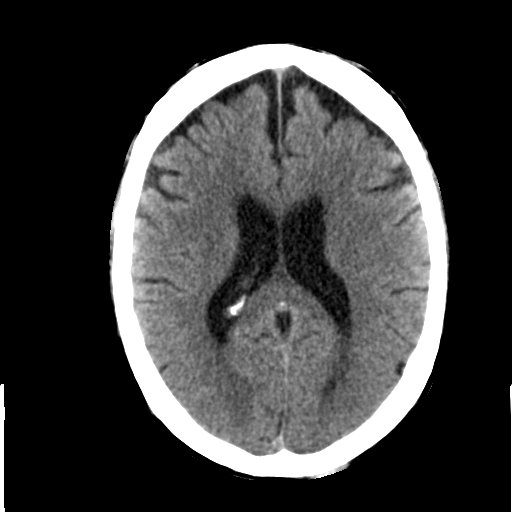
[im 16/31  bone]
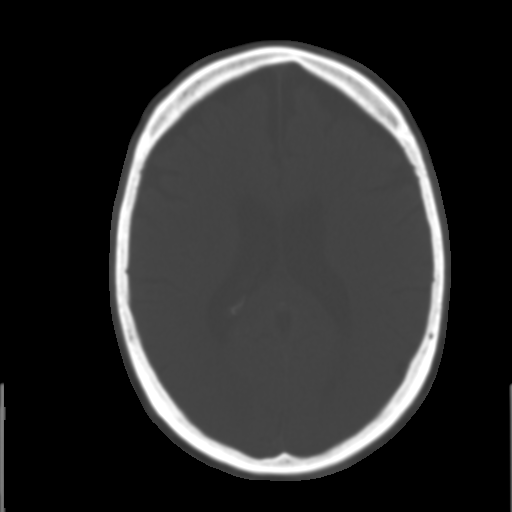
[im 18/31  brain]
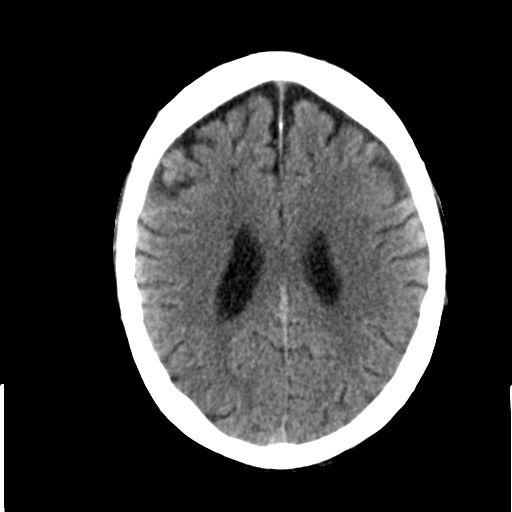
[im 20/31  brain]
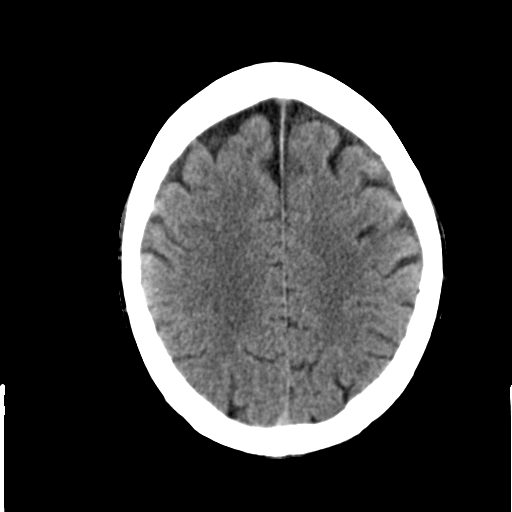
[im 22/31  brain]
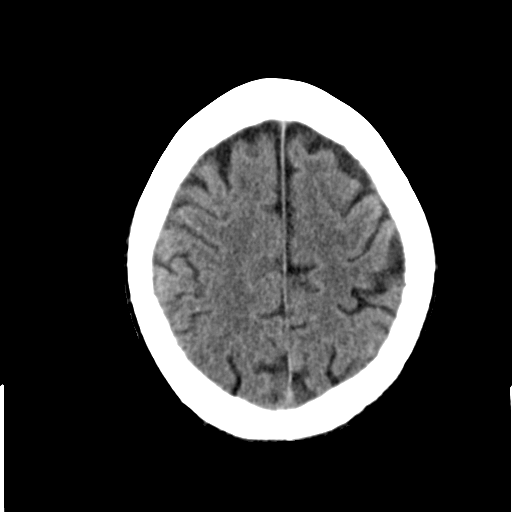
[im 23/31  brain]
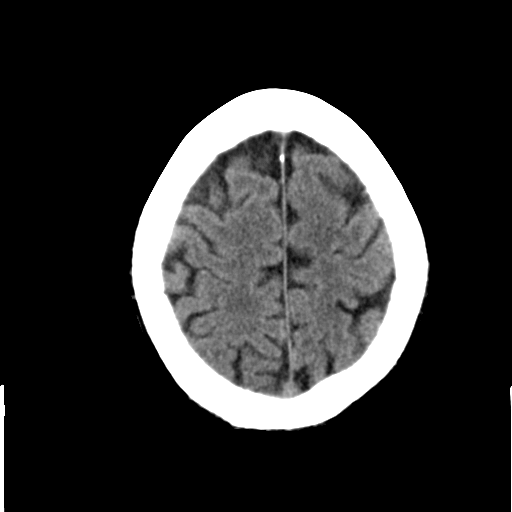
[im 23/31  bone]
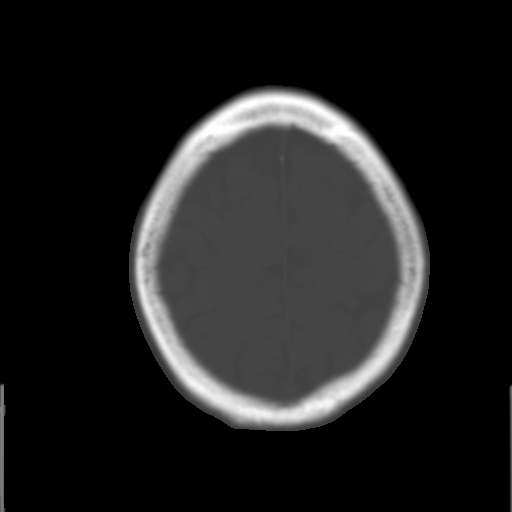
[im 25/31  brain]
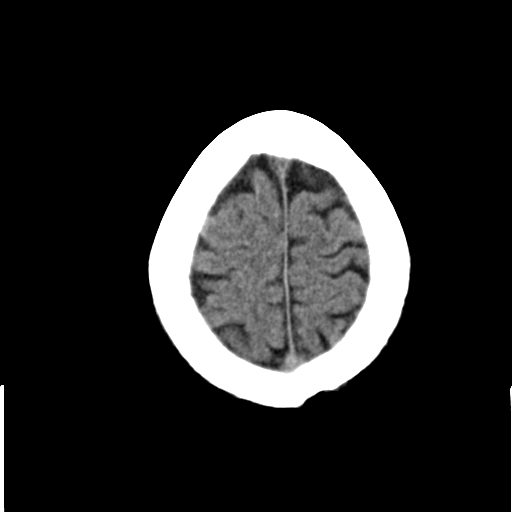
[im 27/31  brain]
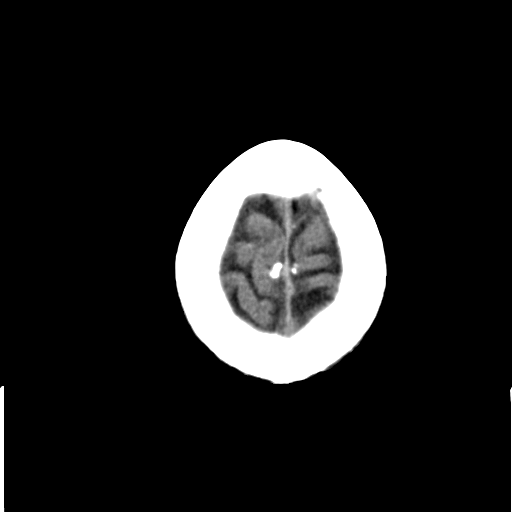
[im 29/31  brain]
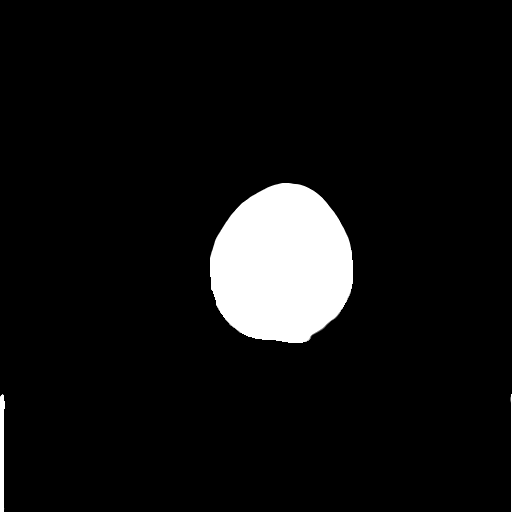

[16 of 30 positions shown; findings below may reference images not displayed]

FINDINGS: Brain: No intracranial hemorrhage, mass effect or midline shift. No
definite acute cortical infarction. No mass lesion is noted on this
unenhanced scan. Stable mild cerebral atrophy. Stable mild
periventricular and patchy subcortical chronic white matter disease.

Vascular: Atherosclerotic calcifications of carotid siphon.

Skull: Negative for fracture or focal lesion.

Sinuses/Orbits: No acute findings.

Other: None.
IMPRESSION: No acute intracranial abnormality. Stable atrophy and chronic white
matter disease. No definite acute cortical infarction.

## 2018-01-24 IMAGING — CT CT HEAD W/O CM
2 series · 14 of 30 positions shown, 16 images · non-contrast
Comparison: 05/14/2015 and prior exams

CLINICAL DATA: 88-year-old male with memory difficulty and recent
TIA.

EXAM:
CT HEAD WITHOUT CONTRAST
TECHNIQUE: Contiguous axial images were obtained from the base of the skull
through the vertex without intravenous contrast.

[Series 2: head wo · axial · 0.44mm/px · z∈[+133,+232]mm · 6 of 32 slices shown, 8 images]
[im 5/32  brain]
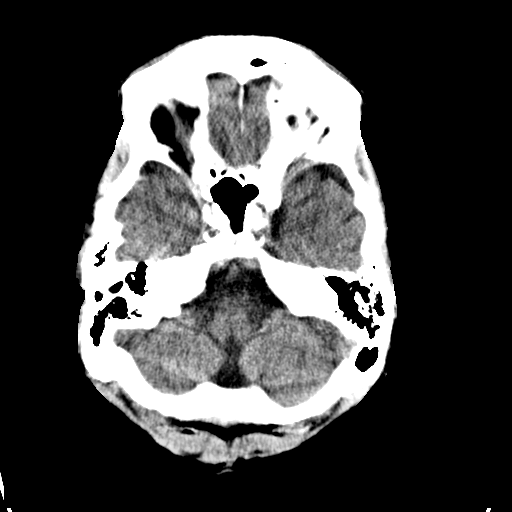
[im 5/32  bone]
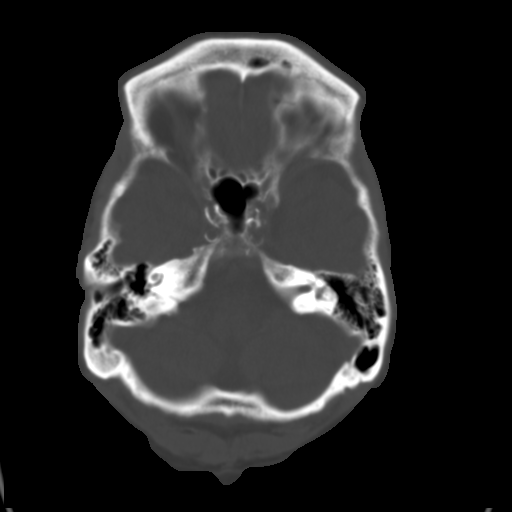
[im 9/32  brain]
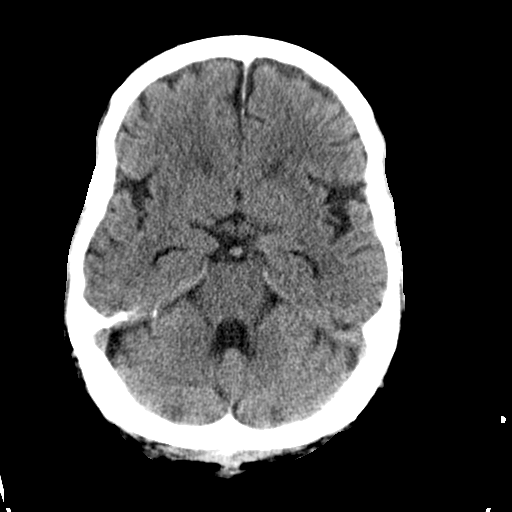
[im 14/32  brain]
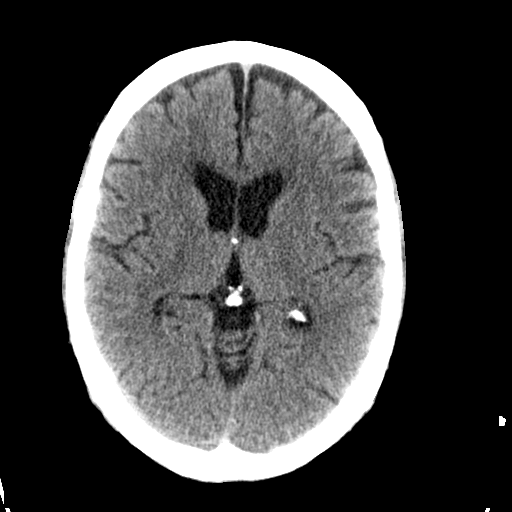
[im 18/32  brain]
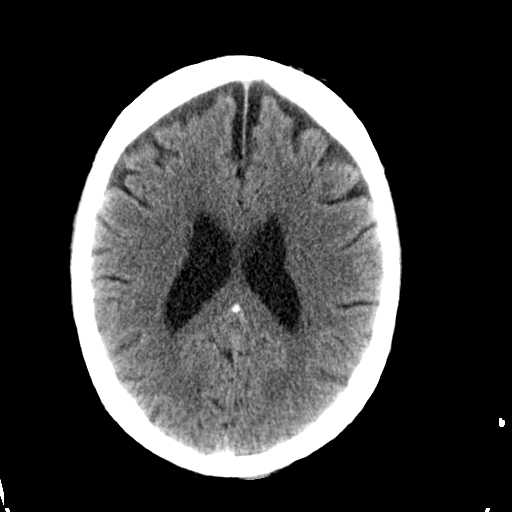
[im 23/32  brain]
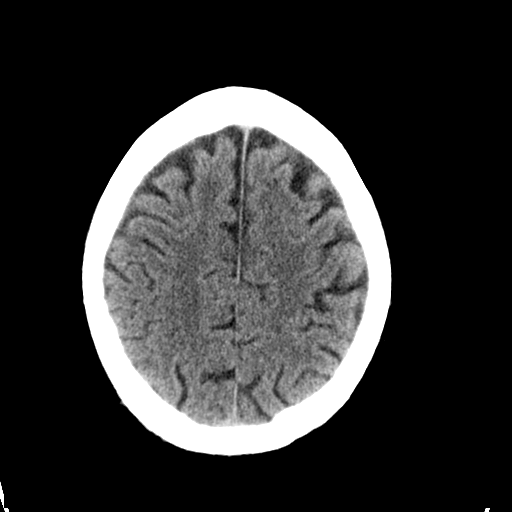
[im 23/32  bone]
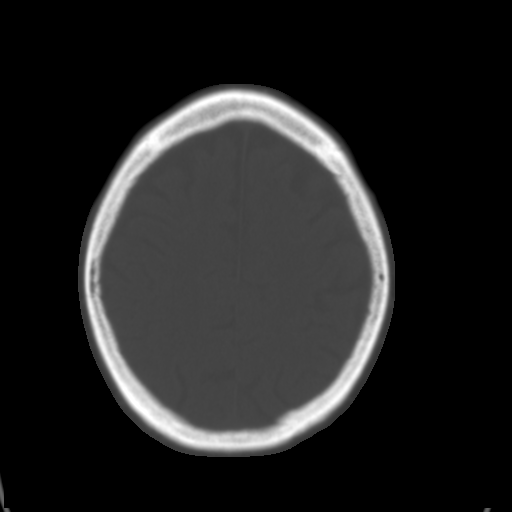
[im 27/32  brain]
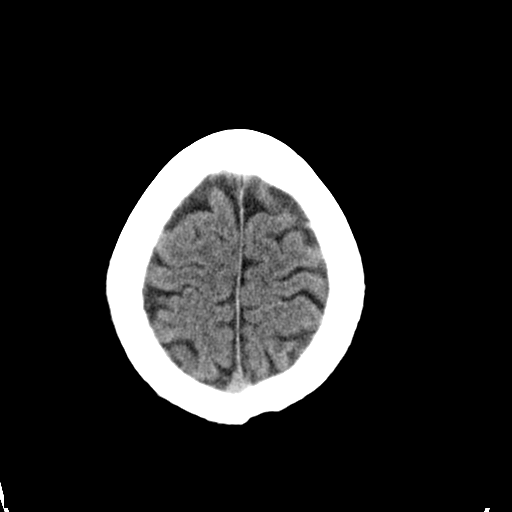

[Series 3: head bone · axial · 0.44mm/px · z∈[+127,+241]mm · 8 of 96 slices shown]
[im 10/96  bone]
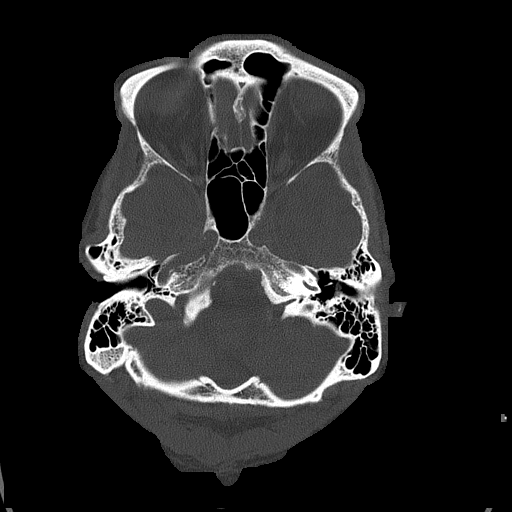
[im 19/96  bone]
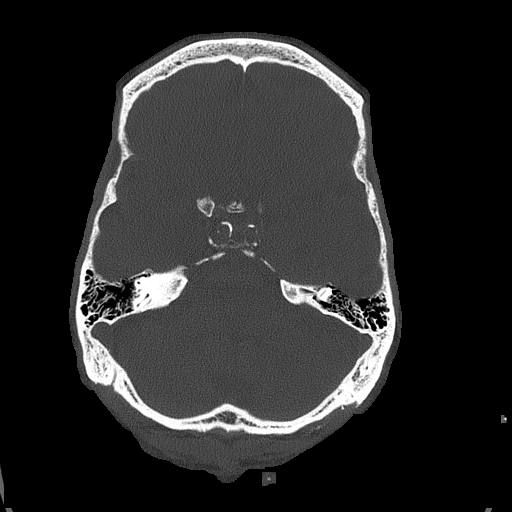
[im 32/96  bone]
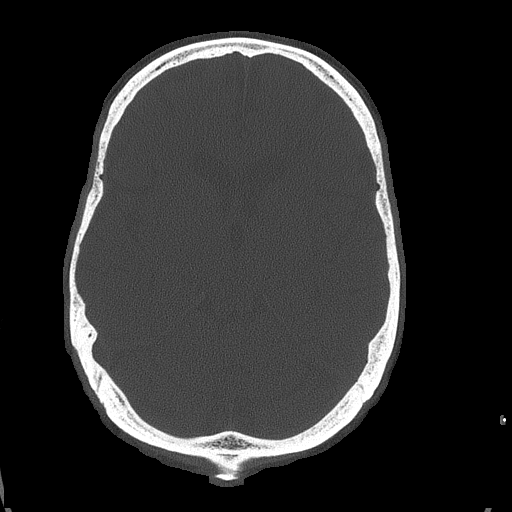
[im 41/96  bone]
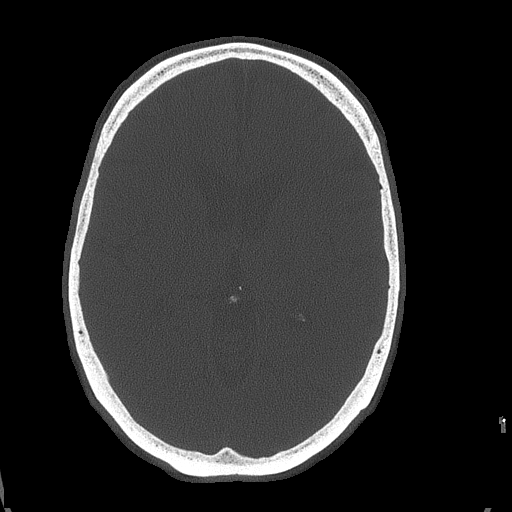
[im 55/96  bone]
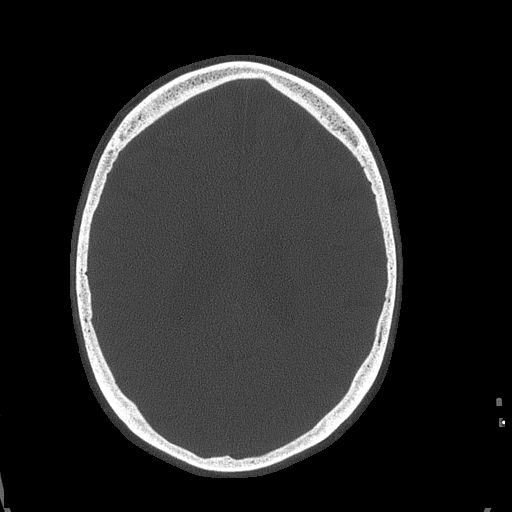
[im 64/96  bone]
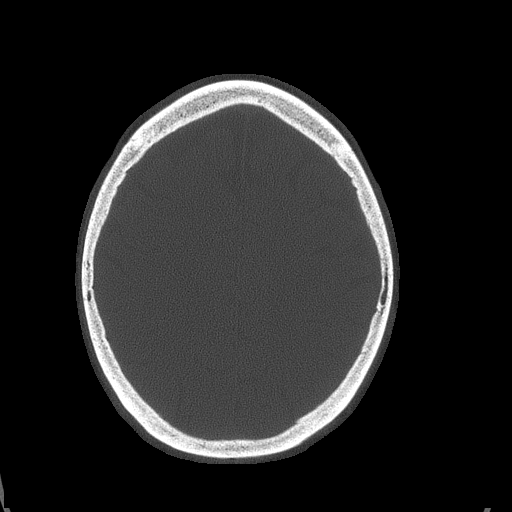
[im 77/96  bone]
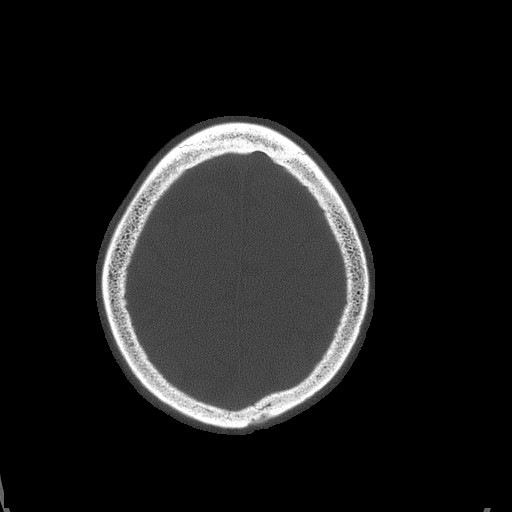
[im 86/96  bone]
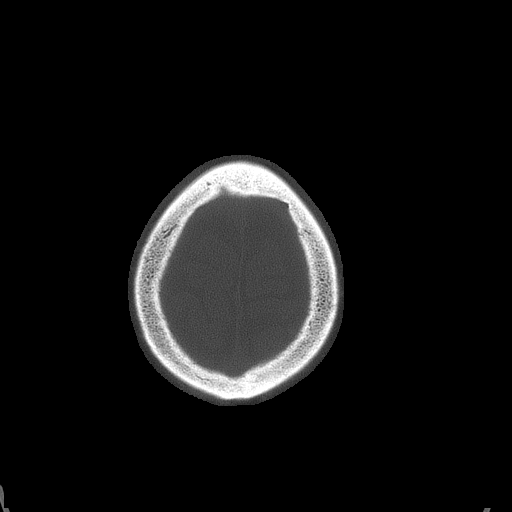

[14 of 30 positions shown; findings below may reference images not displayed]

FINDINGS: Atrophy and mild chronic small-vessel white matter ischemic changes
again noted.

No acute intracranial abnormalities are identified, including mass
lesion or mass effect, hydrocephalus, extra-axial fluid collection,
midline shift, hemorrhage, or acute infarction.

The visualized bony calvarium is unremarkable.
IMPRESSION: No evidence of acute intracranial abnormality.

Atrophy and chronic small-vessel white matter ischemic changes.
